# Patient Record
Sex: Male | Born: 1950
Health system: Southern US, Community
[De-identification: ages and names within clinical notes are randomized; demographics above are authoritative.]

## PROBLEM LIST (undated history)

## (undated) DIAGNOSIS — E7800 Pure hypercholesterolemia, unspecified: Secondary | ICD-10-CM

## (undated) DIAGNOSIS — R7303 Prediabetes: Secondary | ICD-10-CM

## (undated) HISTORY — PX: BACK SURGERY: SHX140

## (undated) HISTORY — PX: NASAL SEPTOPLASTY W/ TURBINOPLASTY: SHX2070

## (undated) HISTORY — DX: Pure hypercholesterolemia, unspecified: E78.00

## (undated) HISTORY — DX: Prediabetes: R73.03

## (undated) HISTORY — PX: JOINT REPLACEMENT: SHX530

---

## 2000-07-30 ENCOUNTER — Ambulatory Visit (HOSPITAL_BASED_OUTPATIENT_CLINIC_OR_DEPARTMENT_OTHER): Admission: RE | Admit: 2000-07-30 | Discharge: 2000-07-30 | Payer: Self-pay | Admitting: Internal Medicine

## 2000-12-08 ENCOUNTER — Encounter: Payer: Self-pay | Admitting: Internal Medicine

## 2000-12-08 ENCOUNTER — Encounter: Admission: RE | Admit: 2000-12-08 | Discharge: 2000-12-08 | Payer: Self-pay | Admitting: Internal Medicine

## 2003-01-24 ENCOUNTER — Ambulatory Visit (HOSPITAL_COMMUNITY): Admission: RE | Admit: 2003-01-24 | Discharge: 2003-01-24 | Payer: Self-pay | Admitting: Gastroenterology

## 2005-11-26 ENCOUNTER — Emergency Department (HOSPITAL_COMMUNITY): Admission: EM | Admit: 2005-11-26 | Discharge: 2005-11-27 | Payer: Self-pay | Admitting: Emergency Medicine

## 2005-12-06 ENCOUNTER — Encounter: Admission: RE | Admit: 2005-12-06 | Discharge: 2005-12-06 | Payer: Self-pay | Admitting: Gastroenterology

## 2006-02-14 ENCOUNTER — Encounter: Admission: RE | Admit: 2006-02-14 | Discharge: 2006-02-14 | Payer: Self-pay | Admitting: Internal Medicine

## 2006-08-15 ENCOUNTER — Encounter: Admission: RE | Admit: 2006-08-15 | Discharge: 2006-08-15 | Payer: Self-pay | Admitting: Orthopedic Surgery

## 2010-04-25 ENCOUNTER — Encounter: Payer: Self-pay | Admitting: Gastroenterology

## 2012-06-28 DIAGNOSIS — J3489 Other specified disorders of nose and nasal sinuses: Secondary | ICD-10-CM | POA: Insufficient documentation

## 2014-03-24 ENCOUNTER — Encounter (HOSPITAL_COMMUNITY): Payer: Self-pay

## 2015-12-23 ENCOUNTER — Encounter: Payer: Self-pay | Admitting: Podiatry

## 2015-12-23 ENCOUNTER — Ambulatory Visit (INDEPENDENT_AMBULATORY_CARE_PROVIDER_SITE_OTHER): Payer: BLUE CROSS/BLUE SHIELD

## 2015-12-23 ENCOUNTER — Ambulatory Visit (INDEPENDENT_AMBULATORY_CARE_PROVIDER_SITE_OTHER): Payer: BLUE CROSS/BLUE SHIELD | Admitting: Podiatry

## 2015-12-23 DIAGNOSIS — M779 Enthesopathy, unspecified: Secondary | ICD-10-CM

## 2015-12-23 DIAGNOSIS — M79671 Pain in right foot: Secondary | ICD-10-CM | POA: Diagnosis not present

## 2015-12-23 DIAGNOSIS — M2041 Other hammer toe(s) (acquired), right foot: Secondary | ICD-10-CM | POA: Diagnosis not present

## 2015-12-23 DIAGNOSIS — M79672 Pain in left foot: Secondary | ICD-10-CM | POA: Diagnosis not present

## 2015-12-23 MED ORDER — TRIAMCINOLONE ACETONIDE 10 MG/ML IJ SUSP
10.0000 mg | Freq: Once | INTRAMUSCULAR | Status: AC
  Administered 2015-12-23: 10 mg

## 2015-12-23 NOTE — Progress Notes (Signed)
Subjective:     Patient ID: Joshua LutesRondol L Winnett, male   DOB: 08/08/1950, 65 y.o.   MRN: [id number]  HPI patient states my right fifth digit has started to really bother me and there is inflammation and fluid in it and I especially have trouble with golf shoes   Review of Systems  All other systems reviewed and are negative.      Objective:   Physical Exam  Constitutional: He is oriented to person, place, and time.  Cardiovascular: Intact distal pulses.   Musculoskeletal: Normal range of motion.  Neurological: He is oriented to person, place, and time.  Skin: Skin is warm.  Nursing note and vitals reviewed.  neurovascular status intact muscle strength adequate range of motion within normal limits with patient noted to have inflammation and pain around the fifth digit right with fluid buildup around the toe itself. Patient is noted to have a rotation of the toe with redness around this area that's localized in nature and has good digital perfusion and is well oriented 3     Assessment:     Hammertoe deformity with inflammatory changes at the fifth digit right foot with pain    Plan:     H&P condition reviewed did proximal nerve block and carefully reduced fluid from the joint with 2 mg [place] some Kenalog 5 mill grams Xylocaine and applied padding. Reviewed x-rays and the fact that surgery may be necessary for this someday  X-ray report indicates hypertrophy of the head of proximal phalanx fifth digit right foot

## 2015-12-23 NOTE — Progress Notes (Signed)
   Subjective:    Patient ID: Joshua LutesRondol L Badders, male    DOB: 1950/11/28, 65 y.o.   MRN: [id number]  HPI Chief Complaint  Patient presents with  . Nail Problem    R 5th hammertoe ... Pt states he would like advise on golf shoes.       Review of Systems  All other systems reviewed and are negative.      Objective:   Physical Exam        Assessment & Plan:

## 2015-12-28 ENCOUNTER — Telehealth: Payer: Self-pay

## 2015-12-28 NOTE — Telephone Encounter (Signed)
Pt called stating that he is still in pain after the shot and wants to know if there is anything outside of surgery that he can do to relief the pain  X-ray report indicates hypertrophy of the head of proximal phalanx fifth digit right foot    Hammertoe deformity with inflammatory changes at the fifth digit right foot with pain

## 2015-12-29 ENCOUNTER — Telehealth: Payer: Self-pay | Admitting: *Deleted

## 2015-12-29 NOTE — Telephone Encounter (Addendum)
Pt called states he would like to know the next step for help with his hammer toe, the injection lasted only over the weekend. I told pt I reviewed his LOV and it appeared his problem was 2 fold, not just a hammertoe, but also capsulitis which is an inflammation of a fibrous capsule that hold the toe and metatarsal of the foot together, and contains synovial fluid for lubrication and cooling as the joint moves, that in cases of injury whether by over use or structure problems like hammertoe, it gets inflamed. I told pt the injection Dr. Charlsie Merlesegal gave was for the inflammation and the silicone shield and sleeve were to keep the toe from rubbing the shoe. Pt states the shield does give some comfort, but what else can be done other than surgery, that it had been a year since his last visit,but wasn't getting the same results.  I told pt that in some cases the problem has reached a point where palliative care, is not enough and he may need to discuss other options with Dr. Charlsie Merlesegal. I told pt to begin with wearing a stiff bottomed shoe to decrease bending in the joint, which is continuing to injure the area, wear the sleeve or shield that works best for him and I would call back with orders from Dr. Charlsie Merlesegal. 12/30/2015-Pt called for Dr. Beverlee Nimsegal's instructions.  Dr. Charlsie Merlesegal states may try the BioSkin hammer toe splint or may be fitted with Air fracture walker to totally immobilize area to rest foot structures. Pt states he would like to be fitted with the BioSkin hammer toe splint, transferred pt to schedulers to be scheduled on the Nurses Schedule to be fitted.

## 2015-12-29 NOTE — Telephone Encounter (Signed)
Could try a thick orthotic to dispense and also could immobilize with air fx. walker

## 2015-12-29 NOTE — Telephone Encounter (Signed)
Shoe gear modifications and padding

## 2015-12-31 ENCOUNTER — Ambulatory Visit (INDEPENDENT_AMBULATORY_CARE_PROVIDER_SITE_OTHER): Payer: BLUE CROSS/BLUE SHIELD | Admitting: Podiatry

## 2015-12-31 DIAGNOSIS — M779 Enthesopathy, unspecified: Secondary | ICD-10-CM

## 2015-12-31 DIAGNOSIS — M2041 Other hammer toe(s) (acquired), right foot: Secondary | ICD-10-CM | POA: Diagnosis not present

## 2015-12-31 MED ORDER — TRIAMCINOLONE ACETONIDE 10 MG/ML IJ SUSP
10.0000 mg | Freq: Once | INTRAMUSCULAR | Status: AC
  Administered 2015-12-31: 10 mg

## 2016-01-01 NOTE — Progress Notes (Signed)
Subjective:     Patient ID: Joshua Ellis, male   DOB: February 15, 1951, 65 y.o.   MRN: [id number]  HPI patient continues to experience discomfort between the fourth and fifth toes right and on the fifth digit and states the injection that I did on the outside of the toe was not successful   Review of Systems     Objective:   Physical Exam Neurovascular status intact with patient noted to have discoloration irritation around the head of the proximal phalanx fifth digit right with no keratotic tissue formation and inflammation fourth interspace right foot    Assessment:     Difficult to tell why he continues to experience this discomfort and I did discuss he may ultimately require arthroplasty and partial syndactylization but today I injected the fourth interspace 3 mg dexamethasone Kenalog 5 mg Xylocaine and advised on wider shoes    Plan:     Injection administered tolerated well and again may require surgery which I discussed with

## 2017-03-20 ENCOUNTER — Ambulatory Visit: Payer: BLUE CROSS/BLUE SHIELD | Admitting: Podiatry

## 2017-03-20 ENCOUNTER — Encounter: Payer: Self-pay | Admitting: Podiatry

## 2017-03-20 ENCOUNTER — Other Ambulatory Visit: Payer: Self-pay | Admitting: Podiatry

## 2017-03-20 ENCOUNTER — Ambulatory Visit (INDEPENDENT_AMBULATORY_CARE_PROVIDER_SITE_OTHER): Payer: BLUE CROSS/BLUE SHIELD

## 2017-03-20 DIAGNOSIS — M2040 Other hammer toe(s) (acquired), unspecified foot: Secondary | ICD-10-CM

## 2017-03-20 DIAGNOSIS — M21622 Bunionette of left foot: Secondary | ICD-10-CM

## 2017-03-20 DIAGNOSIS — M779 Enthesopathy, unspecified: Secondary | ICD-10-CM | POA: Diagnosis not present

## 2017-03-20 DIAGNOSIS — M79672 Pain in left foot: Secondary | ICD-10-CM

## 2017-03-20 MED ORDER — TRIAMCINOLONE ACETONIDE 10 MG/ML IJ SUSP
10.0000 mg | Freq: Once | INTRAMUSCULAR | Status: AC
  Administered 2017-03-20: 10 mg

## 2017-03-21 NOTE — Progress Notes (Signed)
Subjective:   Patient ID: Joshua Ellis, male   DOB: 66 y.o.   MRN: [id number]   HPI Patient presents stating that the outside of my left foot has started to hurt the way my right foot had in the past.  It is hard to wear shoe gear with comfortably in certain shoes aggravated   ROS      Objective:  Physical Exam  Neurovascular status intact with inflammation pain in the left fifth metatarsal head with fluid buildup around the joint itself     Assessment:  Inflammatory capsulitis around the fifth MPJ left     Plan:  H&P condition reviewed and advised on wider shoes and did careful steroidal injection around the fifth MPJ 3 mg dexamethasone Kenalog 5 mg Xylocaine and advised on soaks.  Reappoint for us to recheck

## 2017-04-27 DIAGNOSIS — M25511 Pain in right shoulder: Secondary | ICD-10-CM | POA: Insufficient documentation

## 2017-05-18 DIAGNOSIS — N529 Male erectile dysfunction, unspecified: Secondary | ICD-10-CM | POA: Insufficient documentation

## 2017-06-16 DIAGNOSIS — H6123 Impacted cerumen, bilateral: Secondary | ICD-10-CM | POA: Insufficient documentation

## 2017-06-16 DIAGNOSIS — R519 Headache, unspecified: Secondary | ICD-10-CM | POA: Insufficient documentation

## 2017-06-16 DIAGNOSIS — R51 Headache: Secondary | ICD-10-CM

## 2017-06-16 DIAGNOSIS — G8929 Other chronic pain: Secondary | ICD-10-CM | POA: Insufficient documentation

## 2017-10-02 ENCOUNTER — Encounter: Payer: Self-pay | Admitting: Podiatry

## 2017-10-02 ENCOUNTER — Ambulatory Visit: Payer: BLUE CROSS/BLUE SHIELD | Admitting: Podiatry

## 2017-10-02 DIAGNOSIS — M779 Enthesopathy, unspecified: Secondary | ICD-10-CM | POA: Diagnosis not present

## 2017-10-02 MED ORDER — TRIAMCINOLONE ACETONIDE 10 MG/ML IJ SUSP
10.0000 mg | Freq: Once | INTRAMUSCULAR | Status: AC
  Administered 2017-10-02: 10 mg

## 2017-10-03 NOTE — Progress Notes (Signed)
Subjective:   Patient ID: Joshua Ellis, male   DOB: 67 y.o.   MRN: [id number]   HPI Patient points to the outside left fifth metatarsal states is been getting sore and making it hard to wear shoe gear comfortably   ROS      Objective:  Physical Exam  Neurovascular status intact with patient's left fifth MPJ found to have fluid accumulation     Assessment:  Inflammatory capsulitis fifth MPJ left     Plan:  Careful injection left fifth MPJ administered 3 mg Kenalog 5 mg Xylocaine advised on wider shoes and reappoint to recheck

## 2018-01-10 DIAGNOSIS — M542 Cervicalgia: Secondary | ICD-10-CM | POA: Diagnosis not present

## 2018-01-10 DIAGNOSIS — M25511 Pain in right shoulder: Secondary | ICD-10-CM | POA: Diagnosis not present

## 2018-01-17 DIAGNOSIS — M75101 Unspecified rotator cuff tear or rupture of right shoulder, not specified as traumatic: Secondary | ICD-10-CM | POA: Diagnosis not present

## 2018-01-17 DIAGNOSIS — S161XXD Strain of muscle, fascia and tendon at neck level, subsequent encounter: Secondary | ICD-10-CM | POA: Diagnosis not present

## 2018-01-24 DIAGNOSIS — M542 Cervicalgia: Secondary | ICD-10-CM | POA: Diagnosis not present

## 2018-01-27 DIAGNOSIS — R200 Anesthesia of skin: Secondary | ICD-10-CM | POA: Diagnosis not present

## 2018-01-29 DIAGNOSIS — M542 Cervicalgia: Secondary | ICD-10-CM | POA: Diagnosis not present

## 2018-01-29 DIAGNOSIS — M545 Low back pain: Secondary | ICD-10-CM | POA: Diagnosis not present

## 2018-01-31 ENCOUNTER — Other Ambulatory Visit (HOSPITAL_COMMUNITY): Payer: Self-pay | Admitting: Orthopaedic Surgery

## 2018-01-31 DIAGNOSIS — M5442 Lumbago with sciatica, left side: Secondary | ICD-10-CM

## 2018-01-31 DIAGNOSIS — M542 Cervicalgia: Secondary | ICD-10-CM

## 2018-02-02 ENCOUNTER — Ambulatory Visit (HOSPITAL_COMMUNITY)
Admission: RE | Admit: 2018-02-02 | Discharge: 2018-02-02 | Disposition: A | Discharge status: Home / Self Care | Payer: BLUE CROSS/BLUE SHIELD | Source: Ambulatory Visit | Attending: Orthopaedic Surgery | Admitting: Orthopaedic Surgery

## 2018-02-02 ENCOUNTER — Ambulatory Visit (HOSPITAL_COMMUNITY): Payer: BLUE CROSS/BLUE SHIELD

## 2018-02-05 ENCOUNTER — Encounter (HOSPITAL_COMMUNITY): Payer: Self-pay

## 2018-02-05 ENCOUNTER — Observation Stay (HOSPITAL_COMMUNITY)
Admission: RE | Admit: 2018-02-05 | Discharge: 2018-02-05 | Disposition: A | Discharge status: Home / Self Care | Payer: Medicare Other | Source: Ambulatory Visit | Attending: Orthopaedic Surgery | Admitting: Orthopaedic Surgery

## 2018-02-05 ENCOUNTER — Ambulatory Visit (HOSPITAL_COMMUNITY)
Admission: RE | Admit: 2018-02-05 | Discharge: 2018-02-05 | Disposition: A | Discharge status: Home / Self Care | Payer: Medicare Other | Source: Ambulatory Visit | Attending: Orthopaedic Surgery | Admitting: Orthopaedic Surgery

## 2018-02-05 ENCOUNTER — Ambulatory Visit (HOSPITAL_COMMUNITY): Payer: Medicare Other

## 2018-02-05 DIAGNOSIS — M542 Cervicalgia: Secondary | ICD-10-CM

## 2018-02-05 DIAGNOSIS — M48061 Spinal stenosis, lumbar region without neurogenic claudication: Secondary | ICD-10-CM | POA: Diagnosis not present

## 2018-02-05 DIAGNOSIS — M5136 Other intervertebral disc degeneration, lumbar region: Secondary | ICD-10-CM | POA: Diagnosis not present

## 2018-02-05 DIAGNOSIS — M545 Low back pain: Secondary | ICD-10-CM | POA: Diagnosis not present

## 2018-02-05 DIAGNOSIS — M5442 Lumbago with sciatica, left side: Secondary | ICD-10-CM

## 2018-02-05 DIAGNOSIS — M5126 Other intervertebral disc displacement, lumbar region: Secondary | ICD-10-CM | POA: Diagnosis not present

## 2018-02-13 DIAGNOSIS — M5416 Radiculopathy, lumbar region: Secondary | ICD-10-CM | POA: Diagnosis not present

## 2018-02-13 DIAGNOSIS — R030 Elevated blood-pressure reading, without diagnosis of hypertension: Secondary | ICD-10-CM | POA: Diagnosis not present

## 2018-02-13 DIAGNOSIS — M9983 Other biomechanical lesions of lumbar region: Secondary | ICD-10-CM | POA: Diagnosis not present

## 2018-02-13 DIAGNOSIS — M542 Cervicalgia: Secondary | ICD-10-CM | POA: Diagnosis not present

## 2018-02-13 DIAGNOSIS — Z6825 Body mass index (BMI) 25.0-25.9, adult: Secondary | ICD-10-CM | POA: Diagnosis not present

## 2018-02-13 DIAGNOSIS — M5136 Other intervertebral disc degeneration, lumbar region: Secondary | ICD-10-CM | POA: Diagnosis not present

## 2018-02-21 DIAGNOSIS — M25511 Pain in right shoulder: Secondary | ICD-10-CM | POA: Diagnosis not present

## 2018-03-20 DIAGNOSIS — N529 Male erectile dysfunction, unspecified: Secondary | ICD-10-CM | POA: Diagnosis not present

## 2018-03-21 DIAGNOSIS — H43813 Vitreous degeneration, bilateral: Secondary | ICD-10-CM | POA: Diagnosis not present

## 2018-03-21 DIAGNOSIS — H34831 Tributary (branch) retinal vein occlusion, right eye, with macular edema: Secondary | ICD-10-CM | POA: Diagnosis not present

## 2018-03-21 DIAGNOSIS — H43392 Other vitreous opacities, left eye: Secondary | ICD-10-CM | POA: Diagnosis not present

## 2018-03-21 DIAGNOSIS — H3582 Retinal ischemia: Secondary | ICD-10-CM | POA: Diagnosis not present

## 2018-03-23 DIAGNOSIS — M25511 Pain in right shoulder: Secondary | ICD-10-CM | POA: Diagnosis not present

## 2018-04-06 DIAGNOSIS — N429 Disorder of prostate, unspecified: Secondary | ICD-10-CM | POA: Diagnosis not present

## 2018-04-06 DIAGNOSIS — N529 Male erectile dysfunction, unspecified: Secondary | ICD-10-CM | POA: Diagnosis not present

## 2018-04-12 DIAGNOSIS — S161XXA Strain of muscle, fascia and tendon at neck level, initial encounter: Secondary | ICD-10-CM | POA: Diagnosis not present

## 2018-04-14 DIAGNOSIS — M542 Cervicalgia: Secondary | ICD-10-CM | POA: Diagnosis not present

## 2018-04-14 DIAGNOSIS — M47812 Spondylosis without myelopathy or radiculopathy, cervical region: Secondary | ICD-10-CM | POA: Diagnosis not present

## 2018-04-17 DIAGNOSIS — N528 Other male erectile dysfunction: Secondary | ICD-10-CM | POA: Diagnosis not present

## 2018-04-25 DIAGNOSIS — Z23 Encounter for immunization: Secondary | ICD-10-CM | POA: Diagnosis not present

## 2018-04-25 DIAGNOSIS — R7303 Prediabetes: Secondary | ICD-10-CM | POA: Diagnosis not present

## 2018-04-25 DIAGNOSIS — Z125 Encounter for screening for malignant neoplasm of prostate: Secondary | ICD-10-CM | POA: Diagnosis not present

## 2018-04-25 DIAGNOSIS — Z0000 Encounter for general adult medical examination without abnormal findings: Secondary | ICD-10-CM | POA: Diagnosis not present

## 2018-04-25 DIAGNOSIS — E7800 Pure hypercholesterolemia, unspecified: Secondary | ICD-10-CM | POA: Diagnosis not present

## 2018-04-27 DIAGNOSIS — H34831 Tributary (branch) retinal vein occlusion, right eye, with macular edema: Secondary | ICD-10-CM | POA: Diagnosis not present

## 2018-05-08 DIAGNOSIS — M25511 Pain in right shoulder: Secondary | ICD-10-CM | POA: Diagnosis not present

## 2018-05-08 DIAGNOSIS — M13811 Other specified arthritis, right shoulder: Secondary | ICD-10-CM | POA: Diagnosis not present

## 2018-05-08 DIAGNOSIS — M13819 Other specified arthritis, unspecified shoulder: Secondary | ICD-10-CM | POA: Diagnosis not present

## 2018-05-08 DIAGNOSIS — M75111 Incomplete rotator cuff tear or rupture of right shoulder, not specified as traumatic: Secondary | ICD-10-CM | POA: Diagnosis not present

## 2018-05-14 DIAGNOSIS — M25511 Pain in right shoulder: Secondary | ICD-10-CM | POA: Diagnosis not present

## 2018-05-17 DIAGNOSIS — M75120 Complete rotator cuff tear or rupture of unspecified shoulder, not specified as traumatic: Secondary | ICD-10-CM | POA: Diagnosis not present

## 2018-05-17 DIAGNOSIS — M25511 Pain in right shoulder: Secondary | ICD-10-CM | POA: Diagnosis not present

## 2018-06-04 DIAGNOSIS — G8918 Other acute postprocedural pain: Secondary | ICD-10-CM | POA: Diagnosis not present

## 2018-06-04 DIAGNOSIS — M75111 Incomplete rotator cuff tear or rupture of right shoulder, not specified as traumatic: Secondary | ICD-10-CM | POA: Diagnosis not present

## 2018-06-04 DIAGNOSIS — M7541 Impingement syndrome of right shoulder: Secondary | ICD-10-CM | POA: Diagnosis not present

## 2018-06-04 DIAGNOSIS — M75121 Complete rotator cuff tear or rupture of right shoulder, not specified as traumatic: Secondary | ICD-10-CM | POA: Diagnosis not present

## 2018-06-22 DIAGNOSIS — M25611 Stiffness of right shoulder, not elsewhere classified: Secondary | ICD-10-CM | POA: Diagnosis not present

## 2018-06-25 DIAGNOSIS — M25611 Stiffness of right shoulder, not elsewhere classified: Secondary | ICD-10-CM | POA: Diagnosis not present

## 2018-06-27 DIAGNOSIS — M25611 Stiffness of right shoulder, not elsewhere classified: Secondary | ICD-10-CM | POA: Diagnosis not present

## 2018-07-03 ENCOUNTER — Other Ambulatory Visit: Payer: Self-pay | Admitting: Specialist

## 2018-07-03 DIAGNOSIS — M25511 Pain in right shoulder: Secondary | ICD-10-CM

## 2018-07-04 ENCOUNTER — Other Ambulatory Visit: Payer: Self-pay

## 2018-07-04 ENCOUNTER — Ambulatory Visit
Admission: RE | Admit: 2018-07-04 | Discharge: 2018-07-04 | Disposition: A | Discharge status: Home / Self Care | Payer: Medicare Other | Source: Ambulatory Visit | Attending: Specialist | Admitting: Specialist

## 2018-07-04 DIAGNOSIS — M25511 Pain in right shoulder: Secondary | ICD-10-CM | POA: Diagnosis not present

## 2018-07-19 DIAGNOSIS — M25611 Stiffness of right shoulder, not elsewhere classified: Secondary | ICD-10-CM | POA: Diagnosis not present

## 2018-07-24 DIAGNOSIS — M25511 Pain in right shoulder: Secondary | ICD-10-CM | POA: Diagnosis not present

## 2018-07-26 DIAGNOSIS — M25511 Pain in right shoulder: Secondary | ICD-10-CM | POA: Diagnosis not present

## 2018-07-31 DIAGNOSIS — M25611 Stiffness of right shoulder, not elsewhere classified: Secondary | ICD-10-CM | POA: Diagnosis not present

## 2018-08-02 DIAGNOSIS — M25611 Stiffness of right shoulder, not elsewhere classified: Secondary | ICD-10-CM | POA: Diagnosis not present

## 2018-08-07 DIAGNOSIS — M25511 Pain in right shoulder: Secondary | ICD-10-CM | POA: Diagnosis not present

## 2018-08-10 DIAGNOSIS — M25511 Pain in right shoulder: Secondary | ICD-10-CM | POA: Diagnosis not present

## 2018-08-14 DIAGNOSIS — M25511 Pain in right shoulder: Secondary | ICD-10-CM | POA: Diagnosis not present

## 2018-08-16 DIAGNOSIS — M25511 Pain in right shoulder: Secondary | ICD-10-CM | POA: Diagnosis not present

## 2018-08-21 DIAGNOSIS — M25511 Pain in right shoulder: Secondary | ICD-10-CM | POA: Diagnosis not present

## 2018-08-23 DIAGNOSIS — M25511 Pain in right shoulder: Secondary | ICD-10-CM | POA: Diagnosis not present

## 2018-08-28 DIAGNOSIS — M25511 Pain in right shoulder: Secondary | ICD-10-CM | POA: Diagnosis not present

## 2018-08-30 DIAGNOSIS — M25511 Pain in right shoulder: Secondary | ICD-10-CM | POA: Diagnosis not present

## 2018-09-05 DIAGNOSIS — M25511 Pain in right shoulder: Secondary | ICD-10-CM | POA: Diagnosis not present

## 2018-09-11 DIAGNOSIS — M25511 Pain in right shoulder: Secondary | ICD-10-CM | POA: Diagnosis not present

## 2018-09-28 DIAGNOSIS — H348312 Tributary (branch) retinal vein occlusion, right eye, stable: Secondary | ICD-10-CM | POA: Diagnosis not present

## 2018-09-28 DIAGNOSIS — H43813 Vitreous degeneration, bilateral: Secondary | ICD-10-CM | POA: Diagnosis not present

## 2018-09-28 DIAGNOSIS — H43392 Other vitreous opacities, left eye: Secondary | ICD-10-CM | POA: Diagnosis not present

## 2018-11-19 DIAGNOSIS — M25511 Pain in right shoulder: Secondary | ICD-10-CM | POA: Diagnosis not present

## 2018-12-19 DIAGNOSIS — Z23 Encounter for immunization: Secondary | ICD-10-CM | POA: Diagnosis not present

## 2018-12-30 ENCOUNTER — Emergency Department (HOSPITAL_BASED_OUTPATIENT_CLINIC_OR_DEPARTMENT_OTHER): Payer: Medicare Other

## 2018-12-30 ENCOUNTER — Encounter (HOSPITAL_BASED_OUTPATIENT_CLINIC_OR_DEPARTMENT_OTHER): Payer: Self-pay | Admitting: Emergency Medicine

## 2018-12-30 ENCOUNTER — Other Ambulatory Visit: Payer: Self-pay

## 2018-12-30 ENCOUNTER — Emergency Department (HOSPITAL_BASED_OUTPATIENT_CLINIC_OR_DEPARTMENT_OTHER)
Admission: EM | Admit: 2018-12-30 | Discharge: 2018-12-30 | Disposition: A | Discharge status: Home / Self Care | Payer: Medicare Other | Attending: Emergency Medicine | Admitting: Emergency Medicine

## 2018-12-30 DIAGNOSIS — R079 Chest pain, unspecified: Secondary | ICD-10-CM | POA: Insufficient documentation

## 2018-12-30 DIAGNOSIS — Z79899 Other long term (current) drug therapy: Secondary | ICD-10-CM | POA: Diagnosis not present

## 2018-12-30 LAB — CBC WITH DIFFERENTIAL/PLATELET
Abs Immature Granulocytes: 0.01 10*3/uL (ref 0.00–0.07)
Basophils Absolute: 0 10*3/uL (ref 0.0–0.1)
Basophils Relative: 0 %
Eosinophils Absolute: 0.3 10*3/uL (ref 0.0–0.5)
Eosinophils Relative: 3 %
HCT: 43.1 % (ref 39.0–52.0)
Hemoglobin: 13.6 g/dL (ref 13.0–17.0)
Immature Granulocytes: 0 %
Lymphocytes Relative: 37 %
Lymphs Abs: 3.1 10*3/uL (ref 0.7–4.0)
MCH: 28.1 pg (ref 26.0–34.0)
MCHC: 31.6 g/dL (ref 30.0–36.0)
MCV: 89 fL (ref 80.0–100.0)
Monocytes Absolute: 0.5 10*3/uL (ref 0.1–1.0)
Monocytes Relative: 6 %
Neutro Abs: 4.5 10*3/uL (ref 1.7–7.7)
Neutrophils Relative %: 54 %
Platelets: 208 10*3/uL (ref 150–400)
RBC: 4.84 MIL/uL (ref 4.22–5.81)
RDW: 13.1 % (ref 11.5–15.5)
WBC: 8.4 10*3/uL (ref 4.0–10.5)
nRBC: 0 % (ref 0.0–0.2)

## 2018-12-30 LAB — BASIC METABOLIC PANEL
Anion gap: 9 (ref 5–15)
BUN: 12 mg/dL (ref 8–23)
CO2: 28 mmol/L (ref 22–32)
Calcium: 9.2 mg/dL (ref 8.9–10.3)
Chloride: 102 mmol/L (ref 98–111)
Creatinine, Ser: 0.83 mg/dL (ref 0.61–1.24)
GFR calc Af Amer: >60 mL/min (ref >60)
GFR calc non Af Amer: >60 mL/min (ref >60)
Glucose, Bld: 92 mg/dL (ref 70–99)
Potassium: 3.9 mmol/L (ref 3.5–5.1)
Sodium: 139 mmol/L (ref 135–145)

## 2018-12-30 LAB — TROPONIN I (HIGH SENSITIVITY): Troponin I (High Sensitivity): 6 ng/L (ref <18)

## 2018-12-30 NOTE — ED Triage Notes (Signed)
Referred by Urgent Care for further for chest pain. Denies pain at present

## 2018-12-30 NOTE — ED Notes (Signed)
ED Provider at bedside. 

## 2018-12-30 NOTE — ED Provider Notes (Signed)
[facility] EMERGENCY DEPARTMENT Provider Note   CSN: [id number] Arrival date & time: 12/30/18  1537     History   Chief Complaint Chief Complaint  Patient presents with  . Chest Pain    HPI Joshua Ellis is a 68 y.o. male.     HPI  68 year old male presents for evaluation of abnormal ECG and chest discomfort.  This morning after eating breakfast around 830 he developed an uncomfortable feeling in his left chest.  He states it was not even really a pain but something did not feel right.  Lasted only a minute or 2.  Has come and gone a couple times but is not currently present.  Wife insisted he go to a clinic and when seen there they did an ECG that showed some ST elevations and they wanted him to get checked out.  He remains asymptomatic.  During these episodes he has not had any shortness of breath, nausea, vomiting, diaphoresis.  No leg swelling.  History reviewed. No pertinent past medical history.  Patient Active Problem List   Diagnosis Date Noted  . Bilateral impacted cerumen 06/16/2017  . Chronic nonintractable headache 06/16/2017  . Erectile dysfunction 05/18/2017  . Pain in joint of right shoulder 04/27/2017  . Nasal obstruction 06/28/2012    Past Surgical History:  Procedure Laterality Date  . BACK SURGERY    . JOINT REPLACEMENT    . NASAL SEPTOPLASTY W/ TURBINOPLASTY          Home Medications    Prior to Admission medications   Medication Sig Start Date End Date Taking? Authorizing Provider  fluticasone (FLONASE) 50 MCG/ACT nasal spray Place 2 sprays into both nostrils daily.    [provider]    Family History No family history on file.  Social History Social History   Tobacco Use  . Smoking status: Never Smoker  . Smokeless tobacco: Never Used  Substance Use Topics  . Alcohol use: Never    Frequency: Never  . Drug use: Never     Allergies   Patient has no known allergies.   Review of Systems Review of  Systems  Constitutional: Negative for diaphoresis.  Respiratory: Negative for shortness of breath.   Cardiovascular: Positive for chest pain. Negative for leg swelling.  Gastrointestinal: Negative for abdominal pain, nausea and vomiting.  All other systems reviewed and are negative.    Physical Exam Updated Vital Signs BP (!) 163/95 (BP Location: Right Arm)   Pulse 73   Temp 98.4 F (36.9 C) (Oral)   Resp 16   Ht 5\' 7"  (1.702 m)   Wt 69.9 kg   SpO2 100%   BMI 24.12 kg/m   Physical Exam Vitals signs and nursing note reviewed.  Constitutional:      General: He is not in acute distress.    Appearance: He is well-developed. He is not ill-appearing or diaphoretic.  HENT:     Head: Normocephalic and atraumatic.     Right Ear: External ear normal.     Left Ear: External ear normal.     Nose: Nose normal.  Eyes:     General:        Right eye: No discharge.        Left eye: No discharge.  Neck:     Musculoskeletal: Neck supple.  Cardiovascular:     Rate and Rhythm: Normal rate and regular rhythm.     Ellis sounds: Normal Ellis sounds.  Pulmonary:  Effort: Pulmonary effort is normal.     Breath sounds: Normal breath sounds.  Chest:     Chest wall: No tenderness.  Abdominal:     Palpations: Abdomen is soft.     Tenderness: There is no abdominal tenderness.  Skin:    General: Skin is warm and dry.  Neurological:     Mental Status: He is alert.  Psychiatric:        Mood and Affect: Mood is not anxious.      ED Treatments / Results  Labs (all labs ordered are listed, but only abnormal results are displayed) Labs Reviewed  BASIC METABOLIC PANEL  CBC WITH DIFFERENTIAL/PLATELET  TROPONIN I (HIGH SENSITIVITY)    EKG EKG Interpretation  Date/Time:  Sunday December 30 2018 15:45:48 EDT Ventricular Rate:  69 PR Interval:    QRS Duration: 73 QT Interval:  390 QTC Calculation: 418 R Axis:   73 Text Interpretation:  Sinus arrhythmia Minimal ST elevation,  anterior leads Baseline wander in lead(s) V2 no acute ST/T changes No old tracing to compare Confirmed by Sherwood Gambler [phone]) on 12/30/2018 3:55:22 PM   Radiology Dg Chest Portable 1 View  Result Date: 12/30/2018 CLINICAL DATA:  Pt c/o chest pressure and "odd sensation" in left upper chest. No other symptoms or pertinent history to note. chest pain, transient EXAM: PORTABLE CHEST 1 VIEW COMPARISON:  None. FINDINGS: Normal mediastinum and cardiac silhouette. No effusion, infiltrate pneumothorax. Normal pulmonary vasculature. Rounded density adjacent to the LEFT hilum is favored an "end on" pulmonary vessel. IMPRESSION: No acute cardiopulmonary process. Electronically Signed   By: Suzy Bouchard M.D.   On: 12/30/2018 16:40    Procedures Procedures (including critical care time)  Medications Ordered in ED Medications - No data to display   Initial Impression / Assessment and Plan / ED Course  I have reviewed the triage vital signs and the nursing notes.  Pertinent labs & imaging results that were available during my care of the patient were reviewed by me and considered in my medical decision making (see chart for details).        ECG here is not concerning for ischemia.  The PCPs ECG appear to show some minimal but nonischemic ST elevation anteriorly but would not qualify for STEMI.  Given his highly atypical discomfort that was very fleeting, in combination with no symptoms while the ECG was being performed, I have very low suspicion this is ACS.  Discussed options with patient and we checked a troponin as well as some other labs and chest x-ray.  These are benign.  Offered second troponin but he declines and would like to go home.  Given the lower suspicion for ACS this is pretty reasonable and we discussed that technically MI has not been ruled out and that he should return if any symptoms recur or worsen.  Otherwise, very low suspicion for PE or dissection.  Follow-up with PCP as  needed.  Final Clinical Impressions(s) / ED Diagnoses   Final diagnoses:  Nonspecific chest pain    ED Discharge Orders    None       Sherwood Gambler, MD 12/30/18 [phone]

## 2018-12-30 NOTE — Discharge Instructions (Addendum)
If you develop recurrent, continued, or worsening chest pain, shortness of breath, fever, vomiting, abdominal or back pain, or any other new/concerning symptoms then return to the ER for evaluation.  

## 2019-01-01 DIAGNOSIS — R030 Elevated blood-pressure reading, without diagnosis of hypertension: Secondary | ICD-10-CM | POA: Diagnosis not present

## 2019-01-14 DIAGNOSIS — R21 Rash and other nonspecific skin eruption: Secondary | ICD-10-CM | POA: Diagnosis not present

## 2019-01-17 DIAGNOSIS — M25611 Stiffness of right shoulder, not elsewhere classified: Secondary | ICD-10-CM | POA: Diagnosis not present

## 2019-01-24 DIAGNOSIS — H348312 Tributary (branch) retinal vein occlusion, right eye, stable: Secondary | ICD-10-CM | POA: Diagnosis not present

## 2019-01-24 DIAGNOSIS — H524 Presbyopia: Secondary | ICD-10-CM | POA: Diagnosis not present

## 2019-01-24 DIAGNOSIS — H25813 Combined forms of age-related cataract, bilateral: Secondary | ICD-10-CM | POA: Diagnosis not present

## 2019-01-24 DIAGNOSIS — H04123 Dry eye syndrome of bilateral lacrimal glands: Secondary | ICD-10-CM | POA: Diagnosis not present

## 2019-01-24 DIAGNOSIS — H40013 Open angle with borderline findings, low risk, bilateral: Secondary | ICD-10-CM | POA: Diagnosis not present

## 2019-03-11 DIAGNOSIS — N5201 Erectile dysfunction due to arterial insufficiency: Secondary | ICD-10-CM | POA: Diagnosis not present

## 2019-04-30 DIAGNOSIS — J309 Allergic rhinitis, unspecified: Secondary | ICD-10-CM | POA: Diagnosis not present

## 2019-04-30 DIAGNOSIS — Z0000 Encounter for general adult medical examination without abnormal findings: Secondary | ICD-10-CM | POA: Diagnosis not present

## 2019-04-30 DIAGNOSIS — R7309 Other abnormal glucose: Secondary | ICD-10-CM | POA: Diagnosis not present

## 2019-04-30 DIAGNOSIS — E7800 Pure hypercholesterolemia, unspecified: Secondary | ICD-10-CM | POA: Diagnosis not present

## 2019-04-30 DIAGNOSIS — Z125 Encounter for screening for malignant neoplasm of prostate: Secondary | ICD-10-CM | POA: Diagnosis not present

## 2019-04-30 DIAGNOSIS — K589 Irritable bowel syndrome without diarrhea: Secondary | ICD-10-CM | POA: Diagnosis not present

## 2019-04-30 DIAGNOSIS — Z1389 Encounter for screening for other disorder: Secondary | ICD-10-CM | POA: Diagnosis not present

## 2019-05-09 DIAGNOSIS — S161XXA Strain of muscle, fascia and tendon at neck level, initial encounter: Secondary | ICD-10-CM | POA: Diagnosis not present

## 2019-06-17 DIAGNOSIS — H40013 Open angle with borderline findings, low risk, bilateral: Secondary | ICD-10-CM | POA: Diagnosis not present

## 2019-06-17 DIAGNOSIS — H1132 Conjunctival hemorrhage, left eye: Secondary | ICD-10-CM | POA: Diagnosis not present

## 2019-06-17 DIAGNOSIS — H04123 Dry eye syndrome of bilateral lacrimal glands: Secondary | ICD-10-CM | POA: Diagnosis not present

## 2019-06-17 DIAGNOSIS — H25813 Combined forms of age-related cataract, bilateral: Secondary | ICD-10-CM | POA: Diagnosis not present

## 2019-06-26 DIAGNOSIS — M79661 Pain in right lower leg: Secondary | ICD-10-CM | POA: Diagnosis not present

## 2019-07-08 DIAGNOSIS — M79661 Pain in right lower leg: Secondary | ICD-10-CM | POA: Diagnosis not present

## 2019-07-09 DIAGNOSIS — M79661 Pain in right lower leg: Secondary | ICD-10-CM | POA: Diagnosis not present

## 2019-07-09 DIAGNOSIS — S86111D Strain of other muscle(s) and tendon(s) of posterior muscle group at lower leg level, right leg, subsequent encounter: Secondary | ICD-10-CM | POA: Diagnosis not present

## 2019-07-11 DIAGNOSIS — S86111D Strain of other muscle(s) and tendon(s) of posterior muscle group at lower leg level, right leg, subsequent encounter: Secondary | ICD-10-CM | POA: Diagnosis not present

## 2019-07-11 DIAGNOSIS — M79661 Pain in right lower leg: Secondary | ICD-10-CM | POA: Diagnosis not present

## 2019-07-15 DIAGNOSIS — S86111D Strain of other muscle(s) and tendon(s) of posterior muscle group at lower leg level, right leg, subsequent encounter: Secondary | ICD-10-CM | POA: Diagnosis not present

## 2019-07-15 DIAGNOSIS — M79661 Pain in right lower leg: Secondary | ICD-10-CM | POA: Diagnosis not present

## 2019-07-17 DIAGNOSIS — S86111D Strain of other muscle(s) and tendon(s) of posterior muscle group at lower leg level, right leg, subsequent encounter: Secondary | ICD-10-CM | POA: Diagnosis not present

## 2019-07-17 DIAGNOSIS — M79661 Pain in right lower leg: Secondary | ICD-10-CM | POA: Diagnosis not present

## 2019-07-22 DIAGNOSIS — S86111D Strain of other muscle(s) and tendon(s) of posterior muscle group at lower leg level, right leg, subsequent encounter: Secondary | ICD-10-CM | POA: Diagnosis not present

## 2019-07-22 DIAGNOSIS — M79661 Pain in right lower leg: Secondary | ICD-10-CM | POA: Diagnosis not present

## 2019-07-24 DIAGNOSIS — S86111D Strain of other muscle(s) and tendon(s) of posterior muscle group at lower leg level, right leg, subsequent encounter: Secondary | ICD-10-CM | POA: Diagnosis not present

## 2019-07-24 DIAGNOSIS — M79661 Pain in right lower leg: Secondary | ICD-10-CM | POA: Diagnosis not present

## 2019-07-29 DIAGNOSIS — S86111D Strain of other muscle(s) and tendon(s) of posterior muscle group at lower leg level, right leg, subsequent encounter: Secondary | ICD-10-CM | POA: Diagnosis not present

## 2019-07-29 DIAGNOSIS — M79661 Pain in right lower leg: Secondary | ICD-10-CM | POA: Diagnosis not present

## 2019-07-31 DIAGNOSIS — S86111D Strain of other muscle(s) and tendon(s) of posterior muscle group at lower leg level, right leg, subsequent encounter: Secondary | ICD-10-CM | POA: Diagnosis not present

## 2019-07-31 DIAGNOSIS — M79661 Pain in right lower leg: Secondary | ICD-10-CM | POA: Diagnosis not present

## 2019-08-01 DIAGNOSIS — L818 Other specified disorders of pigmentation: Secondary | ICD-10-CM | POA: Diagnosis not present

## 2019-08-01 DIAGNOSIS — L821 Other seborrheic keratosis: Secondary | ICD-10-CM | POA: Diagnosis not present

## 2019-08-06 DIAGNOSIS — H04123 Dry eye syndrome of bilateral lacrimal glands: Secondary | ICD-10-CM | POA: Diagnosis not present

## 2019-08-06 DIAGNOSIS — T1511XA Foreign body in conjunctival sac, right eye, initial encounter: Secondary | ICD-10-CM | POA: Diagnosis not present

## 2019-08-26 DIAGNOSIS — M542 Cervicalgia: Secondary | ICD-10-CM | POA: Diagnosis not present

## 2019-09-05 DIAGNOSIS — M542 Cervicalgia: Secondary | ICD-10-CM | POA: Diagnosis not present

## 2019-09-09 DIAGNOSIS — M542 Cervicalgia: Secondary | ICD-10-CM | POA: Diagnosis not present

## 2019-09-10 DIAGNOSIS — M549 Dorsalgia, unspecified: Secondary | ICD-10-CM | POA: Diagnosis not present

## 2019-09-26 DIAGNOSIS — M9907 Segmental and somatic dysfunction of upper extremity: Secondary | ICD-10-CM | POA: Diagnosis not present

## 2019-09-26 DIAGNOSIS — M9901 Segmental and somatic dysfunction of cervical region: Secondary | ICD-10-CM | POA: Diagnosis not present

## 2019-09-26 DIAGNOSIS — M7541 Impingement syndrome of right shoulder: Secondary | ICD-10-CM | POA: Diagnosis not present

## 2019-09-26 DIAGNOSIS — M9902 Segmental and somatic dysfunction of thoracic region: Secondary | ICD-10-CM | POA: Diagnosis not present

## 2019-09-30 DIAGNOSIS — M5412 Radiculopathy, cervical region: Secondary | ICD-10-CM | POA: Insufficient documentation

## 2019-10-14 DIAGNOSIS — M5412 Radiculopathy, cervical region: Secondary | ICD-10-CM | POA: Diagnosis not present

## 2019-10-18 DIAGNOSIS — M5412 Radiculopathy, cervical region: Secondary | ICD-10-CM | POA: Diagnosis not present

## 2019-10-28 ENCOUNTER — Other Ambulatory Visit: Payer: Self-pay

## 2019-10-28 ENCOUNTER — Ambulatory Visit (INDEPENDENT_AMBULATORY_CARE_PROVIDER_SITE_OTHER): Payer: Medicare Other | Admitting: Podiatry

## 2019-10-28 ENCOUNTER — Encounter: Payer: Self-pay | Admitting: Podiatry

## 2019-10-28 DIAGNOSIS — L84 Corns and callosities: Secondary | ICD-10-CM | POA: Diagnosis not present

## 2019-10-28 NOTE — Progress Notes (Signed)
Subjective:   Patient ID: Joshua Ellis, male   DOB: 69 y.o.   MRN: [id number]   HPI Patient presents with lesions on the big toes of both feet that is localized and states that pain is not present currently after pedicure   ROS      Objective:  Physical Exam  Neurovascular status intact with keratotic lesion on the side of the big toe of both feet low-grade in intensity      Assessment:  Keratotic lesion secondary to pressure     Plan:  Advised on continued pedicures and explained the origin of this condition with gait being the problem

## 2019-11-04 DIAGNOSIS — M549 Dorsalgia, unspecified: Secondary | ICD-10-CM | POA: Diagnosis not present

## 2019-11-18 DIAGNOSIS — M549 Dorsalgia, unspecified: Secondary | ICD-10-CM | POA: Diagnosis not present

## 2019-12-04 DIAGNOSIS — N5201 Erectile dysfunction due to arterial insufficiency: Secondary | ICD-10-CM | POA: Diagnosis not present

## 2019-12-23 DIAGNOSIS — M25531 Pain in right wrist: Secondary | ICD-10-CM | POA: Diagnosis not present

## 2020-01-02 DIAGNOSIS — H524 Presbyopia: Secondary | ICD-10-CM | POA: Diagnosis not present

## 2020-01-02 DIAGNOSIS — H40013 Open angle with borderline findings, low risk, bilateral: Secondary | ICD-10-CM | POA: Diagnosis not present

## 2020-01-02 DIAGNOSIS — H25813 Combined forms of age-related cataract, bilateral: Secondary | ICD-10-CM | POA: Diagnosis not present

## 2020-01-02 DIAGNOSIS — H04123 Dry eye syndrome of bilateral lacrimal glands: Secondary | ICD-10-CM | POA: Diagnosis not present

## 2020-01-06 DIAGNOSIS — M19012 Primary osteoarthritis, left shoulder: Secondary | ICD-10-CM | POA: Insufficient documentation

## 2020-01-06 DIAGNOSIS — M25512 Pain in left shoulder: Secondary | ICD-10-CM | POA: Diagnosis not present

## 2020-01-21 DIAGNOSIS — M25512 Pain in left shoulder: Secondary | ICD-10-CM | POA: Diagnosis not present

## 2020-01-30 DIAGNOSIS — M5412 Radiculopathy, cervical region: Secondary | ICD-10-CM | POA: Diagnosis not present

## 2020-01-30 DIAGNOSIS — M25512 Pain in left shoulder: Secondary | ICD-10-CM | POA: Diagnosis not present

## 2020-02-04 DIAGNOSIS — M5021 Other cervical disc displacement,  high cervical region: Secondary | ICD-10-CM | POA: Diagnosis not present

## 2020-02-04 DIAGNOSIS — M4802 Spinal stenosis, cervical region: Secondary | ICD-10-CM | POA: Diagnosis not present

## 2020-02-07 DIAGNOSIS — M5412 Radiculopathy, cervical region: Secondary | ICD-10-CM | POA: Diagnosis not present

## 2020-02-11 DIAGNOSIS — M5412 Radiculopathy, cervical region: Secondary | ICD-10-CM | POA: Diagnosis not present

## 2020-02-21 DIAGNOSIS — M542 Cervicalgia: Secondary | ICD-10-CM | POA: Diagnosis not present

## 2020-02-26 DIAGNOSIS — M542 Cervicalgia: Secondary | ICD-10-CM | POA: Diagnosis not present

## 2020-02-26 DIAGNOSIS — M25512 Pain in left shoulder: Secondary | ICD-10-CM | POA: Diagnosis not present

## 2020-03-02 ENCOUNTER — Ambulatory Visit
Admission: RE | Admit: 2020-03-02 | Discharge: 2020-03-02 | Disposition: A | Discharge status: Home / Self Care | Payer: Medicare Other | Source: Ambulatory Visit | Attending: Physician Assistant | Admitting: Physician Assistant

## 2020-03-02 ENCOUNTER — Other Ambulatory Visit: Payer: Self-pay | Admitting: Physician Assistant

## 2020-03-02 DIAGNOSIS — M47816 Spondylosis without myelopathy or radiculopathy, lumbar region: Secondary | ICD-10-CM | POA: Diagnosis not present

## 2020-03-02 DIAGNOSIS — M545 Low back pain, unspecified: Secondary | ICD-10-CM | POA: Diagnosis not present

## 2020-03-02 DIAGNOSIS — M47817 Spondylosis without myelopathy or radiculopathy, lumbosacral region: Secondary | ICD-10-CM | POA: Diagnosis not present

## 2020-03-02 DIAGNOSIS — M1612 Unilateral primary osteoarthritis, left hip: Secondary | ICD-10-CM | POA: Diagnosis not present

## 2020-03-02 DIAGNOSIS — M79605 Pain in left leg: Secondary | ICD-10-CM

## 2020-03-02 DIAGNOSIS — M1712 Unilateral primary osteoarthritis, left knee: Secondary | ICD-10-CM | POA: Diagnosis not present

## 2020-03-02 DIAGNOSIS — Z23 Encounter for immunization: Secondary | ICD-10-CM | POA: Diagnosis not present

## 2020-03-04 DIAGNOSIS — M169 Osteoarthritis of hip, unspecified: Secondary | ICD-10-CM | POA: Diagnosis not present

## 2020-03-04 DIAGNOSIS — M47816 Spondylosis without myelopathy or radiculopathy, lumbar region: Secondary | ICD-10-CM | POA: Diagnosis not present

## 2020-03-05 DIAGNOSIS — M5412 Radiculopathy, cervical region: Secondary | ICD-10-CM | POA: Diagnosis not present

## 2020-03-11 DIAGNOSIS — M25552 Pain in left hip: Secondary | ICD-10-CM | POA: Diagnosis not present

## 2020-03-11 DIAGNOSIS — M79605 Pain in left leg: Secondary | ICD-10-CM | POA: Diagnosis not present

## 2020-03-11 DIAGNOSIS — R202 Paresthesia of skin: Secondary | ICD-10-CM | POA: Diagnosis not present

## 2020-03-12 DIAGNOSIS — M5412 Radiculopathy, cervical region: Secondary | ICD-10-CM | POA: Diagnosis not present

## 2020-03-18 DIAGNOSIS — M79605 Pain in left leg: Secondary | ICD-10-CM | POA: Diagnosis not present

## 2020-03-18 DIAGNOSIS — M25552 Pain in left hip: Secondary | ICD-10-CM | POA: Diagnosis not present

## 2020-03-18 DIAGNOSIS — R202 Paresthesia of skin: Secondary | ICD-10-CM | POA: Diagnosis not present

## 2020-03-20 DIAGNOSIS — M79605 Pain in left leg: Secondary | ICD-10-CM | POA: Diagnosis not present

## 2020-03-20 DIAGNOSIS — M25552 Pain in left hip: Secondary | ICD-10-CM | POA: Diagnosis not present

## 2020-03-20 DIAGNOSIS — R202 Paresthesia of skin: Secondary | ICD-10-CM | POA: Diagnosis not present

## 2020-03-30 DIAGNOSIS — M7541 Impingement syndrome of right shoulder: Secondary | ICD-10-CM | POA: Diagnosis not present

## 2020-04-17 DIAGNOSIS — M25511 Pain in right shoulder: Secondary | ICD-10-CM | POA: Diagnosis not present

## 2020-04-27 DIAGNOSIS — M25511 Pain in right shoulder: Secondary | ICD-10-CM | POA: Diagnosis not present

## 2020-05-01 DIAGNOSIS — Z0000 Encounter for general adult medical examination without abnormal findings: Secondary | ICD-10-CM | POA: Diagnosis not present

## 2020-05-01 DIAGNOSIS — M47816 Spondylosis without myelopathy or radiculopathy, lumbar region: Secondary | ICD-10-CM | POA: Diagnosis not present

## 2020-05-01 DIAGNOSIS — R7309 Other abnormal glucose: Secondary | ICD-10-CM | POA: Diagnosis not present

## 2020-05-01 DIAGNOSIS — E7800 Pure hypercholesterolemia, unspecified: Secondary | ICD-10-CM | POA: Diagnosis not present

## 2020-05-01 DIAGNOSIS — Z1389 Encounter for screening for other disorder: Secondary | ICD-10-CM | POA: Diagnosis not present

## 2020-05-16 DIAGNOSIS — S61209A Unspecified open wound of unspecified finger without damage to nail, initial encounter: Secondary | ICD-10-CM | POA: Diagnosis not present

## 2020-05-16 DIAGNOSIS — W268XXA Contact with other sharp object(s), not elsewhere classified, initial encounter: Secondary | ICD-10-CM | POA: Diagnosis not present

## 2020-05-27 NOTE — Progress Notes (Signed)
Cardiology Office Note:    Date:  05/28/2020   ID:  Joshua Ellis, DOB [birth year], MRN [id number]  PCP:  Joshua Housekeeper, MD  Cardiologist:  Joshua Ellis.   Referring MD: Joshua Housekeeper, MD   Chief Complaint  Patient presents with  . Hyperlipidemia    History of Present Illness:    Joshua Ellis is a 69 y.o. male with a hx of hyperlipidemia,, Prediabetes, total knee replacement, elevated BP without diagnosis of hypertension, erectile dysfunction, and prior history of chest pain (2020).  Joshua Ellis has a family history of CAD (father died suddenly at age 48).  Otherwise Joshua significant risk factors other than prediabetes and hyperlipidemia.  LDL cholesterol levels have been noted to be greater than 130.  He is on multiple over-the-counter natural therapies with most recent LDL of 140 total cholesterol 217.  HDL cholesterol was 61 on blood work performed in January 2022.  He denies claudication, orthopnea, palpitations, syncope, angina, and stroke.   Past Medical History:  Diagnosis Date  . Hypercholesteremia   . Prediabetes     Past Surgical History:  Procedure Laterality Date  . BACK SURGERY    . JOINT REPLACEMENT    . NASAL SEPTOPLASTY W/ TURBINOPLASTY      Current Medications: Current Meds  Medication Sig  . Ascorbic Acid (VITAMIN C PO) Take 1 capsule by mouth daily.  . Cyanocobalamin (VITAMIN B-12 PO) Take 1 tablet by mouth daily.  . Flaxseed, Linseed, (FLAX SEEDS PO) Take by mouth daily.  . fluticasone (FLONASE) 50 MCG/ACT nasal spray Place 2 sprays into both nostrils daily. Takes PRN  . Garlic 100 MG TABS Take 100 mg by mouth daily.  Joshua Ellis Oil Omega-3 500 MG CAPS Take 500 mg by mouth daily.  . Multiple Vitamin (MULTIVITAMIN) capsule Take 1 capsule by mouth daily.  . [DISCONTINUED] sildenafil (REVATIO) 20 MG tablet Take 20-40 mg by mouth daily as needed.     Allergies:   Patient has Joshua known allergies.   Social History    Socioeconomic History  . Marital status: Single    Spouse name: Not on Ellis  . Number of children: Not on Ellis  . Years of education: Not on Ellis  . Highest education level: Not on Ellis  Occupational History  . Not on Ellis  Tobacco Use  . Smoking status: Never Smoker  . Smokeless tobacco: Never Used  Substance and Sexual Activity  . Alcohol use: Never  . Drug use: Never  . Sexual activity: Not on Ellis  Other Topics Concern  . Not on Ellis  Social History Narrative  . Not on Ellis   Social Determinants of Health   Financial Resource Strain: Not on Ellis  Food Insecurity: Not on Ellis  Transportation Needs: Not on Ellis  Physical Activity: Not on Ellis  Stress: Not on Ellis  Social Connections: Not on Ellis     Family History: The patient's family history includes CAD in his father; Dementia in his mother; Diabetes in his father.  ROS:   Please see the history of present illness.    Joshua complaints all other systems reviewed and are negative.  EKGs/Labs/Other Studies Reviewed:    The following studies were reviewed today: None  EKG:  EKG performed September 2020 revealed normal sinus rhythm with early repole.  Recent Labs: Joshua results found for requested labs within last 8760 hours.  Recent Lipid Panel Joshua results found for: CHOL, TRIG, HDL, CHOLHDL, VLDL, LDLCALC,  LDLDIRECT  Physical Exam:    VS:  There were Joshua vitals taken for this visit.    Wt Readings from Last 3 Encounters:  12/30/18 154 lb (69.9 kg)     GEN: Healthy-appearing. Joshua acute distress HEENT: Normal NECK: Joshua JVD. LYMPHATICS: Joshua lymphadenopathy CARDIAC: Joshua murmur. RRR Joshua gallop, or edema. VASCULAR:  Normal Pulses. Joshua bruits. RESPIRATORY:  Clear to auscultation without rales, wheezing or rhonchi  ABDOMEN: Soft, non-tender, non-distended, Joshua pulsatile mass, MUSCULOSKELETAL: Joshua deformity  SKIN: Warm and dry NEUROLOGIC:  Alert and oriented x 3 PSYCHIATRIC:  Normal affect   ASSESSMENT:    1.  Hyperlipidemia LDL goal <100   2. Erectile dysfunction due to arterial insufficiency   3. Cervical disc disease   4. Elevated BP without diagnosis of hypertension   5. Educated about COVID-19 virus infection    PLAN:    In order of problems listed above:  1. Significant risk factors including prediabetes, untreated hyperlipidemia, and family history.  He is not excited about pharmacologic therapy of hyperlipidemia.  Coronary calcium score will be performed to give [place] a better idea about the magnitude of risk. 2. Likely related to atherosclerosis. 3. Not discussed. 4. Mildly elevated systolic pressure.  Will monitor. 5. Vaccinated, boosted, and practicing social distancing.  Overall education and awareness concerning primary risk prevention was discussed in detail: LDL less than 70, hemoglobin A1c less than 7, blood pressure target less than 130/80 mmHg, >150 minutes of moderate aerobic activity per week, avoidance of smoking, weight control (via diet and exercise), and continued surveillance/management of/for obstructive sleep apnea.    Medication Adjustments/Labs and Tests Ordered: Current medicines are reviewed at length with the patient today.  Concerns regarding medicines are outlined above.  Joshua orders of the defined types were placed in this encounter.  Joshua orders of the defined types were placed in this encounter.   There are Joshua Patient Instructions on Ellis for this visit.   Signed, Lesleigh Noe, MD  05/28/2020 1:57 PM    [facility]

## 2020-05-28 ENCOUNTER — Encounter: Payer: Self-pay | Admitting: Interventional Cardiology

## 2020-05-28 ENCOUNTER — Ambulatory Visit: Payer: Medicare Other | Admitting: Interventional Cardiology

## 2020-05-28 ENCOUNTER — Other Ambulatory Visit: Payer: Self-pay

## 2020-05-28 VITALS — BP 138/76 | HR 60 | Ht 67.0 in | Wt 156.0 lb

## 2020-05-28 DIAGNOSIS — E785 Hyperlipidemia, unspecified: Secondary | ICD-10-CM

## 2020-05-28 DIAGNOSIS — N5201 Erectile dysfunction due to arterial insufficiency: Secondary | ICD-10-CM | POA: Diagnosis not present

## 2020-05-28 DIAGNOSIS — M5090 Cervical disc disorder, unspecified, unspecified cervical region: Secondary | ICD-10-CM | POA: Diagnosis not present

## 2020-05-28 DIAGNOSIS — R030 Elevated blood-pressure reading, without diagnosis of hypertension: Secondary | ICD-10-CM | POA: Diagnosis not present

## 2020-05-28 DIAGNOSIS — Z7189 Other specified counseling: Secondary | ICD-10-CM

## 2020-05-28 NOTE — Patient Instructions (Signed)
Medication Instructions:  Your physician recommends that you continue on your current medications as directed. Please refer to the Current Medication list given to you today.  *If you need a refill on your cardiac medications before your next appointment, please call your pharmacy*   Lab Work: None If you have labs (blood work) drawn today and your tests are completely normal, you will receive your results only by: . MyChart Message (if you have MyChart) OR . A paper copy in the mail If you have any lab test that is abnormal or we need to change your treatment, we will call you to review the results.   Testing/Procedures: Your physician recommends that you have a Calcium Score performed.   Follow-Up: At [facility], you and your health needs are our priority.  As part of our continuing mission to provide you with exceptional heart care, we have created designated Provider Care Teams.  These Care Teams include your primary Cardiologist (physician) and Advanced Practice Providers (APPs -  Physician Assistants and Nurse Practitioners) who all work together to provide you with the care you need, when you need it.  We recommend signing up for the patient portal called "MyChart".  Sign up information is provided on this After Visit Summary.  MyChart is used to connect with patients for Virtual Visits (Telemedicine).  Patients are able to view lab/test results, encounter notes, upcoming appointments, etc.  Non-urgent messages can be sent to your provider as well.   To learn more about what you can do with MyChart, go to [email or web address].com.    Your next appointment:   As needed  The format for your next appointment:   In Person  Provider:   You may see Dr. Henry Smith or one of the following Advanced Practice Providers on your designated Care Team:    Jill McDaniel, NP    Other Instructions   

## 2020-06-25 ENCOUNTER — Ambulatory Visit (INDEPENDENT_AMBULATORY_CARE_PROVIDER_SITE_OTHER)
Admission: RE | Admit: 2020-06-25 | Discharge: 2020-06-25 | Disposition: A | Discharge status: Home / Self Care | Payer: Self-pay | Source: Ambulatory Visit | Attending: Interventional Cardiology | Admitting: Interventional Cardiology

## 2020-06-25 ENCOUNTER — Telehealth: Payer: Self-pay | Admitting: Interventional Cardiology

## 2020-06-25 ENCOUNTER — Other Ambulatory Visit: Payer: Self-pay

## 2020-06-25 DIAGNOSIS — E785 Hyperlipidemia, unspecified: Secondary | ICD-10-CM

## 2020-06-25 DIAGNOSIS — M25561 Pain in right knee: Secondary | ICD-10-CM | POA: Diagnosis not present

## 2020-06-25 NOTE — Telephone Encounter (Signed)
RN spoke to patient regarding CT results from this morning. RN explained that Dr. Katrinka Blazing has yet to review the results since the scan was just completed this morning. Patient states he was just unsure of how long it would be before he received his results and Dr.Smith's recommendations. RN assured him that we would reach out to him with results and recommendations when they are reviewed by Dr.Smith. Patient verbalized understanding and thanked Charity fundraiser for calling.

## 2020-06-25 NOTE — Telephone Encounter (Signed)
    Pt is calling about CT result, he said he got the result on mychart but no numbers or anything yet. He wanted to know what Dr. Katrinka Blazing recommendations, especially about his medications/supplements.

## 2020-06-29 ENCOUNTER — Telehealth: Payer: Self-pay | Admitting: *Deleted

## 2020-06-29 DIAGNOSIS — E785 Hyperlipidemia, unspecified: Secondary | ICD-10-CM

## 2020-06-29 MED ORDER — ROSUVASTATIN CALCIUM 20 MG PO TABS: 20.0000 mg | ORAL_TABLET | Freq: Every day | ORAL | 3 refills | Status: DC

## 2020-06-29 NOTE — Telephone Encounter (Signed)
-----   Message from Lyn Records, MD sent at 06/28/2020 10:56 AM EDT ----- LDL was 140 in January, so needs Rosuvastatin 20 mg daily with Liver and lipid in 6 weeks.

## 2020-07-21 DIAGNOSIS — H903 Sensorineural hearing loss, bilateral: Secondary | ICD-10-CM | POA: Diagnosis not present

## 2020-08-14 ENCOUNTER — Other Ambulatory Visit: Payer: Self-pay

## 2020-08-14 ENCOUNTER — Other Ambulatory Visit: Payer: Medicare Other | Admitting: *Deleted

## 2020-08-14 DIAGNOSIS — E785 Hyperlipidemia, unspecified: Secondary | ICD-10-CM

## 2020-08-14 DIAGNOSIS — M545 Low back pain, unspecified: Secondary | ICD-10-CM | POA: Diagnosis not present

## 2020-08-14 LAB — HEPATIC FUNCTION PANEL
ALT: 28 IU/L (ref 0–44)
AST: 31 IU/L (ref 0–40)
Albumin: 4.5 g/dL (ref 3.8–4.8)
Alkaline Phosphatase: 70 IU/L (ref 44–121)
Bilirubin Total: 0.7 mg/dL (ref 0.0–1.2)
Bilirubin, Direct: 0.18 mg/dL (ref 0.00–0.40)
Total Protein: 6.7 g/dL (ref 6.0–8.5)

## 2020-08-14 LAB — LIPID PANEL
Chol/HDL Ratio: 2.2 ratio (ref 0.0–5.0)
Cholesterol, Total: 140 mg/dL (ref 100–199)
HDL: 63 mg/dL (ref >39)
LDL Chol Calc (NIH): 64 mg/dL (ref 0–99)
Triglycerides: 61 mg/dL (ref 0–149)
VLDL Cholesterol Cal: 13 mg/dL (ref 5–40)

## 2020-08-17 ENCOUNTER — Other Ambulatory Visit: Payer: Self-pay | Admitting: *Deleted

## 2020-08-17 DIAGNOSIS — E785 Hyperlipidemia, unspecified: Secondary | ICD-10-CM

## 2020-08-19 DIAGNOSIS — M545 Low back pain, unspecified: Secondary | ICD-10-CM | POA: Diagnosis not present

## 2020-09-02 DIAGNOSIS — R21 Rash and other nonspecific skin eruption: Secondary | ICD-10-CM | POA: Diagnosis not present

## 2020-09-22 DIAGNOSIS — L989 Disorder of the skin and subcutaneous tissue, unspecified: Secondary | ICD-10-CM | POA: Diagnosis not present

## 2020-09-25 ENCOUNTER — Other Ambulatory Visit: Payer: Self-pay

## 2020-09-25 MED ORDER — ROSUVASTATIN CALCIUM 20 MG PO TABS: 20.0000 mg | ORAL_TABLET | Freq: Every day | ORAL | 3 refills | Status: DC

## 2020-09-25 MED ORDER — ROSUVASTATIN CALCIUM 20 MG PO TABS: 20.0000 mg | ORAL_TABLET | Freq: Every day | ORAL | 0 refills | Status: DC

## 2020-09-25 NOTE — Telephone Encounter (Signed)
Pt's medication rosuvastatin was sent to [facility] mail order pharmacy and a 15 day supply to [facility] in [place], as requested by pt, until mail order arrives. Confirmation for both.

## 2020-10-28 DIAGNOSIS — M5459 Other low back pain: Secondary | ICD-10-CM | POA: Diagnosis not present

## 2020-11-11 DIAGNOSIS — M545 Low back pain, unspecified: Secondary | ICD-10-CM | POA: Diagnosis not present

## 2020-11-23 DIAGNOSIS — M545 Low back pain, unspecified: Secondary | ICD-10-CM | POA: Diagnosis not present

## 2020-12-21 DIAGNOSIS — Z23 Encounter for immunization: Secondary | ICD-10-CM | POA: Diagnosis not present

## 2020-12-21 DIAGNOSIS — M25552 Pain in left hip: Secondary | ICD-10-CM | POA: Diagnosis not present

## 2020-12-30 DIAGNOSIS — M25512 Pain in left shoulder: Secondary | ICD-10-CM | POA: Diagnosis not present

## 2021-01-11 DIAGNOSIS — H40013 Open angle with borderline findings, low risk, bilateral: Secondary | ICD-10-CM | POA: Diagnosis not present

## 2021-01-11 DIAGNOSIS — H04123 Dry eye syndrome of bilateral lacrimal glands: Secondary | ICD-10-CM | POA: Diagnosis not present

## 2021-01-11 DIAGNOSIS — H348312 Tributary (branch) retinal vein occlusion, right eye, stable: Secondary | ICD-10-CM | POA: Diagnosis not present

## 2021-01-11 DIAGNOSIS — H25813 Combined forms of age-related cataract, bilateral: Secondary | ICD-10-CM | POA: Diagnosis not present

## 2021-02-10 ENCOUNTER — Other Ambulatory Visit: Payer: Medicare Other

## 2021-02-10 ENCOUNTER — Other Ambulatory Visit: Payer: Self-pay

## 2021-02-10 DIAGNOSIS — E785 Hyperlipidemia, unspecified: Secondary | ICD-10-CM

## 2021-02-10 LAB — HEPATIC FUNCTION PANEL
ALT: 23 IU/L (ref 0–44)
AST: 28 IU/L (ref 0–40)
Albumin: 4.4 g/dL (ref 3.8–4.8)
Alkaline Phosphatase: 77 IU/L (ref 44–121)
Bilirubin Total: 0.6 mg/dL (ref 0.0–1.2)
Bilirubin, Direct: 0.16 mg/dL (ref 0.00–0.40)
Total Protein: 6.4 g/dL (ref 6.0–8.5)

## 2021-02-10 LAB — LIPID PANEL
Chol/HDL Ratio: 2.3 ratio (ref 0.0–5.0)
Cholesterol, Total: 148 mg/dL (ref 100–199)
HDL: 63 mg/dL (ref >39)
LDL Chol Calc (NIH): 73 mg/dL (ref 0–99)
Triglycerides: 56 mg/dL (ref 0–149)
VLDL Cholesterol Cal: 12 mg/dL (ref 5–40)

## 2021-02-15 NOTE — Progress Notes (Signed)
Cardiology Office Note:    Date:  02/16/2021   ID:  Joshua Ellis, DOB May 21, 1950, MRN [id number]  PCP:  Georgann Housekeeper, MD  Cardiologist:  None   Referring MD: Georgann Housekeeper, MD   Chief Complaint  Patient presents with   Follow-up    Asymptomatic CAD Hyperlipidemia     History of Present Illness:    Joshua Ellis is a 70 y.o. male with a hx of hyperlipidemia,, Prediabetes, total knee replacement, elevated BP without diagnosis of hypertension, erectile dysfunction, and prior history of chest pain (2020).  Is doing well.  He is asymptomatic.  He is physically active.  He plays golf at least 3 times per week.  No cardiovascular complaints..  He denies  medication side effects.  Past Medical History:  Diagnosis Date   Hypercholesteremia    Prediabetes     Past Surgical History:  Procedure Laterality Date   BACK SURGERY     JOINT REPLACEMENT     NASAL SEPTOPLASTY W/ TURBINOPLASTY      Current Medications: Current Meds  Medication Sig   Ascorbic Acid (VITAMIN C PO) Take 1 capsule by mouth daily.   Cyanocobalamin (VITAMIN B-12 PO) Take 1 tablet by mouth daily.   ELDERBERRY PO Take by mouth daily.   Garlic 100 MG TABS Take 100 mg by mouth daily.   Krill Oil Omega-3 500 MG CAPS Take 500 mg by mouth daily.   Multiple Vitamin (MULTIVITAMIN) capsule Take 1 capsule by mouth daily.   rosuvastatin (CRESTOR) 20 MG tablet Take 1 tablet (20 mg total) by mouth daily.     Allergies:   Patient has no known allergies.   Social History   Socioeconomic History   Marital status: Married    Spouse name: Not on file   Number of children: Not on file   Years of education: Not on file   Highest education level: Not on file  Occupational History   Not on file  Tobacco Use   Smoking status: Never   Smokeless tobacco: Never  Substance and Sexual Activity   Alcohol use: Never   Drug use: Never   Sexual activity: Not on file  Other Topics Concern   Not on file  Social  History Narrative   Not on file   Social Determinants of Health   Financial Resource Strain: Not on file  Food Insecurity: Not on file  Transportation Needs: Not on file  Physical Activity: Not on file  Stress: Not on file  Social Connections: Not on file     Family History: The patient's family history includes CAD in his father; Dementia in his mother; Diabetes in his father.  ROS:   Please see the history of present illness.    No complaints.  Some musculoskeletal issues.  All other systems reviewed and are negative.  EKGs/Labs/Other Studies Reviewed:    The following studies were reviewed today:  CORONARY CALCIUM SCORE 06/2020: IMPRESSION: Coronary calcium score of 30. This was 57 percentile for age and sex matched control.  EKG:  EKG normal sinus rhythm with sinus arrhythmia.  Prominent voltage.  Overall normal in appearance  Recent Labs: 02/10/2021: ALT 23  Recent Lipid Panel    Component Value Date/Time   CHOL 148 02/10/2021 0755   TRIG 56 02/10/2021 0755   HDL 63 02/10/2021 0755   CHOLHDL 2.3 02/10/2021 0755   LDLCALC 73 02/10/2021 0755    Physical Exam:    VS:  BP 122/78   Pulse 65  Ht 5\' 7"  (1.702 m)   Wt 149 lb 9.6 oz (67.9 kg)   SpO2 96%   BMI 23.43 kg/m     Wt Readings from Last 3 Encounters:  02/16/21 149 lb 9.6 oz (67.9 kg)  05/28/20 156 lb (70.8 kg)  12/30/18 154 lb (69.9 kg)     GEN: Healthy in appearance.. No acute distress HEENT: Normal NECK: No JVD. LYMPHATICS: No lymphadenopathy CARDIAC: No murmur. RRR no gallop, or edema. VASCULAR: No normal Pulses. No bruits. RESPIRATORY:  Clear to auscultation without rales, wheezing or rhonchi  ABDOMEN: Soft, non-tender, non-distended, No pulsatile mass, MUSCULOSKELETAL: No deformity  SKIN: Warm and dry NEUROLOGIC:  Alert and oriented x 3 PSYCHIATRIC:  Normal affect   ASSESSMENT:    1. Hyperlipidemia LDL goal <100   2. Erectile dysfunction due to arterial insufficiency   3. Elevated  BP without diagnosis of hypertension   4. Coronary artery disease involving native coronary artery of native heart without angina pectoris    PLAN:    In order of problems listed above:  Target LDL on intermediate intensity rosuvastatin 20 mg/day.  No side effects. Did not discuss. Blood pressure is quite good.  Continue monitoring.  Target 130/80 until he turns 82. Asymptomatic.  Overall education and awareness concerning primary risk prevention was discussed in detail: LDL less than 70, hemoglobin A1c less than 7, blood pressure target less than 130/80 mmHg, >150 minutes of moderate aerobic activity per week, avoidance of smoking, weight control (via diet and exercise), and continued surveillance/management of/for obstructive sleep apnea.    Medication Adjustments/Labs and Tests Ordered: Current medicines are reviewed at length with the patient today.  Concerns regarding medicines are outlined above.  Orders Placed This Encounter  Procedures   EKG 12-Lead    No orders of the defined types were placed in this encounter.   Patient Instructions  Medication Instructions:  Your physician recommends that you continue on your current medications as directed. Please refer to the Current Medication list given to you today.  *If you need a refill on your cardiac medications before your next appointment, please call your pharmacy*   Lab Work: None If you have labs (blood work) drawn today and your tests are completely normal, you will receive your results only by: MyChart Message (if you have MyChart) OR A paper copy in the mail If you have any lab test that is abnormal or we need to change your treatment, we will call you to review the results.   Testing/Procedures: None   Follow-Up: At [facility], you and your health needs are our priority.  As part of our continuing mission to provide you with exceptional heart care, we have created designated Provider Care Teams.  These  Care Teams include your primary Cardiologist (physician) and Advanced Practice Providers (APPs -  Physician Assistants and Nurse Practitioners) who all work together to provide you with the care you need, when you need it.  We recommend signing up for the patient portal called "MyChart".  Sign up information is provided on this After Visit Summary.  MyChart is used to connect with patients for Virtual Visits (Telemedicine).  Patients are able to view lab/test results, encounter notes, upcoming appointments, etc.  Non-urgent messages can be sent to your provider as well.   To learn more about what you can do with MyChart, go to [facility].    Your next appointment:   1 year(s)  The format for your next appointment:   In Person  Provider:   Elwanda Brooklyn. Leia Alf, MD    Other Instructions     Signed, Lesleigh Noe, MD  02/16/2021 2:20 PM    [facility]

## 2021-02-16 ENCOUNTER — Encounter: Payer: Self-pay | Admitting: Interventional Cardiology

## 2021-02-16 ENCOUNTER — Other Ambulatory Visit: Payer: Self-pay

## 2021-02-16 ENCOUNTER — Ambulatory Visit: Payer: Medicare Other | Admitting: Interventional Cardiology

## 2021-02-16 VITALS — BP 122/78 | HR 65 | Ht 67.0 in | Wt 149.6 lb

## 2021-02-16 DIAGNOSIS — R030 Elevated blood-pressure reading, without diagnosis of hypertension: Secondary | ICD-10-CM

## 2021-02-16 DIAGNOSIS — N5201 Erectile dysfunction due to arterial insufficiency: Secondary | ICD-10-CM | POA: Diagnosis not present

## 2021-02-16 DIAGNOSIS — E785 Hyperlipidemia, unspecified: Secondary | ICD-10-CM

## 2021-02-16 DIAGNOSIS — I2510 Atherosclerotic heart disease of native coronary artery without angina pectoris: Secondary | ICD-10-CM | POA: Diagnosis not present

## 2021-02-16 NOTE — Patient Instructions (Signed)
Medication Instructions:  ?Your physician recommends that you continue on your current medications as directed. Please refer to the Current Medication list given to you today. ? ?*If you need a refill on your cardiac medications before your next appointment, please call your pharmacy* ? ? ?Lab Work: ?None ?If you have labs (blood work) drawn today and your tests are completely normal, you will receive your results only by: ?MyChart Message (if you have MyChart) OR ?A paper copy in the mail ?If you have any lab test that is abnormal or we need to change your treatment, we will call you to review the results. ? ? ?Testing/Procedures: ?None ? ? ?Follow-Up: ?At [facility], you and your health needs are our priority.  As part of our continuing mission to provide you with exceptional heart care, we have created designated Provider Care Teams.  These Care Teams include your primary Cardiologist (physician) and Advanced Practice Providers (APPs -  Physician Assistants and Nurse Practitioners) who all work together to provide you with the care you need, when you need it. ? ?We recommend signing up for the patient portal called "MyChart".  Sign up information is provided on this After Visit Summary.  MyChart is used to connect with patients for Virtual Visits (Telemedicine).  Patients are able to view lab/test results, encounter notes, upcoming appointments, etc.  Non-urgent messages can be sent to your provider as well.   ?To learn more about what you can do with MyChart, go to [email or web address].   ? ?Your next appointment:   ?1 year(s) ? ?The format for your next appointment:   ?In Person ? ?Provider:   ?Henry W.B. Smith, III, MD  ? ? ?Other Instructions ?  ?

## 2021-02-22 ENCOUNTER — Other Ambulatory Visit (HOSPITAL_BASED_OUTPATIENT_CLINIC_OR_DEPARTMENT_OTHER): Payer: Self-pay | Admitting: Medical

## 2021-02-22 ENCOUNTER — Ambulatory Visit (HOSPITAL_BASED_OUTPATIENT_CLINIC_OR_DEPARTMENT_OTHER)
Admission: RE | Admit: 2021-02-22 | Discharge: 2021-02-22 | Disposition: A | Discharge status: Home / Self Care | Payer: Medicare Other | Source: Ambulatory Visit | Attending: Medical | Admitting: Medical

## 2021-02-22 ENCOUNTER — Other Ambulatory Visit: Payer: Self-pay

## 2021-02-22 DIAGNOSIS — M79604 Pain in right leg: Secondary | ICD-10-CM | POA: Insufficient documentation

## 2021-02-22 DIAGNOSIS — M79661 Pain in right lower leg: Secondary | ICD-10-CM | POA: Diagnosis not present

## 2021-02-22 DIAGNOSIS — M79662 Pain in left lower leg: Secondary | ICD-10-CM | POA: Diagnosis not present

## 2021-02-22 DIAGNOSIS — M79605 Pain in left leg: Secondary | ICD-10-CM | POA: Diagnosis not present

## 2021-02-24 DIAGNOSIS — M179 Osteoarthritis of knee, unspecified: Secondary | ICD-10-CM | POA: Diagnosis not present

## 2021-03-01 DIAGNOSIS — M79604 Pain in right leg: Secondary | ICD-10-CM | POA: Diagnosis not present

## 2021-03-01 DIAGNOSIS — M25561 Pain in right knee: Secondary | ICD-10-CM | POA: Diagnosis not present

## 2021-04-20 DIAGNOSIS — H348312 Tributary (branch) retinal vein occlusion, right eye, stable: Secondary | ICD-10-CM | POA: Diagnosis not present

## 2021-04-20 DIAGNOSIS — H40013 Open angle with borderline findings, low risk, bilateral: Secondary | ICD-10-CM | POA: Diagnosis not present

## 2021-04-20 DIAGNOSIS — H25813 Combined forms of age-related cataract, bilateral: Secondary | ICD-10-CM | POA: Diagnosis not present

## 2021-05-03 DIAGNOSIS — M179 Osteoarthritis of knee, unspecified: Secondary | ICD-10-CM | POA: Diagnosis not present

## 2021-05-03 DIAGNOSIS — Z0000 Encounter for general adult medical examination without abnormal findings: Secondary | ICD-10-CM | POA: Diagnosis not present

## 2021-05-03 DIAGNOSIS — E7800 Pure hypercholesterolemia, unspecified: Secondary | ICD-10-CM | POA: Diagnosis not present

## 2021-05-03 DIAGNOSIS — K589 Irritable bowel syndrome without diarrhea: Secondary | ICD-10-CM | POA: Diagnosis not present

## 2021-05-03 DIAGNOSIS — R7303 Prediabetes: Secondary | ICD-10-CM | POA: Diagnosis not present

## 2021-05-03 DIAGNOSIS — Z1389 Encounter for screening for other disorder: Secondary | ICD-10-CM | POA: Diagnosis not present

## 2021-05-20 DIAGNOSIS — M25511 Pain in right shoulder: Secondary | ICD-10-CM | POA: Diagnosis not present

## 2021-05-21 DIAGNOSIS — F5101 Primary insomnia: Secondary | ICD-10-CM | POA: Diagnosis not present

## 2021-06-10 DIAGNOSIS — M25511 Pain in right shoulder: Secondary | ICD-10-CM | POA: Diagnosis not present

## 2021-07-26 DIAGNOSIS — M25562 Pain in left knee: Secondary | ICD-10-CM | POA: Diagnosis not present

## 2021-07-26 DIAGNOSIS — M25511 Pain in right shoulder: Secondary | ICD-10-CM | POA: Diagnosis not present

## 2021-09-08 DIAGNOSIS — M25512 Pain in left shoulder: Secondary | ICD-10-CM | POA: Diagnosis not present

## 2021-09-16 ENCOUNTER — Ambulatory Visit: Payer: Medicare Other | Attending: Specialist | Admitting: Physical Therapy

## 2021-09-16 DIAGNOSIS — M6281 Muscle weakness (generalized): Secondary | ICD-10-CM | POA: Insufficient documentation

## 2021-09-16 DIAGNOSIS — R293 Abnormal posture: Secondary | ICD-10-CM | POA: Insufficient documentation

## 2021-09-16 DIAGNOSIS — M542 Cervicalgia: Secondary | ICD-10-CM | POA: Insufficient documentation

## 2021-09-16 NOTE — Therapy (Signed)
OUTPATIENT PHYSICAL THERAPY CERVICAL EVALUATION   Patient Name: Joshua Ellis MRN: [id number] DOB:30-Apr-1950, 71 y.o., male Today's Date: 09/16/2021    Past Medical History:  Diagnosis Date   Hypercholesteremia    Prediabetes    Past Surgical History:  Procedure Laterality Date   BACK SURGERY     JOINT REPLACEMENT     NASAL SEPTOPLASTY W/ TURBINOPLASTY     Patient Active Problem List   Diagnosis Date Noted   Osteoarthritis of left acromioclavicular joint 01/06/2020   Cervical radiculopathy 09/30/2019   Bilateral impacted cerumen 06/16/2017   Chronic nonintractable headache 06/16/2017   Erectile dysfunction 05/18/2017   Pain in joint of right shoulder 04/27/2017   Nasal obstruction 06/28/2012    PCP: Georgann Housekeeper, MD  REFERRING PROVIDER: Jene Every, MD  REFERRING DIAG: M54.2 (ICD-10-CM) - Cervicalgia  THERAPY DIAG:  Cervicalgia  Muscle weakness (generalized)  Abnormal posture  Rationale for Evaluation and Treatment Rehabilitation  ONSET DATE: 1 month ago  SUBJECTIVE:                                                                                                                                                                                                         SUBJECTIVE STATEMENT: Lt sided posterior shoulders into Lt posterior neck. Pt does golf multiple times a week. Pt has had cortisone shot and massage which helped a lot.   PERTINENT HISTORY:  hyperlipidemia,, Prediabetes, total knee replacement, elevated BP without diagnosis of hypertension, erectile dysfunction, and prior history of chest pain (2020).  PAIN:  Are you having pain? Yes: NPRS scale: 5/10 Pain location: Lt neck and shoulder Pain description: tightness, achy  Aggravating factors: supine, turning left, golfing Relieving factors: ice, massage  PRECAUTIONS: None  WEIGHT BEARING RESTRICTIONS No  FALLS:  Has patient fallen in last 6 months? No  LIVING ENVIRONMENT: Lives  with: lives with their family Lives in: House/apartment   OCCUPATION: retired - owned financial services    PLOF: Independent  PATIENT GOALS to have less pain   OBJECTIVE:   DIAGNOSTIC FINDINGS:   PATIENT SURVEYS:  FOTO 38   COGNITION: Overall cognitive status: Within functional limits for tasks assessed   SENSATION: WFL  POSTURE: rounded shoulders and forward head  PALPATION: TTP at Lt C4-7 paraspinals, Lt infraspinatus, Lt supraspinatus, Lt levator, Lt rhomboid    CERVICAL ROM:   Active ROM A/PROM (deg) eval  Flexion 45  Extension 25  Right lateral flexion 30  Left lateral flexion 23  Right rotation 35  Left rotation 25   (Blank rows = not  tested)  UPPER EXTREMITY ROM:  Bil UE WFL  UPPER EXTREMITY MMT:  Bil UE 5/5 throughout  CERVICAL SPECIAL TESTS:  Spurling's test: Negative and Distraction test: Negative   PATIENT SURVEYS:  FOTO 56  TODAY'S TREATMENT:  09/16/2021 Examination completed, findings reviewed, pt educated on POC, HEP. Pt motivated to participate in PT and agreeable to attempt recommendations.     PATIENT EDUCATION:  Education details: Dentist Person educated: Patient Education method: Solicitor, Actor cues, Verbal cues, and Handouts Education comprehension: verbalized understanding and returned demonstration   HOME EXERCISE PROGRAM: [email or web address]  ASSESSMENT:  CLINICAL IMPRESSION: Patient is a 71 y.o. male  who was seen today for physical therapy evaluation and treatment for pain at Lt cervical spine and posterior shoulder. Pt golfs multiple times weekly and very active reports he began having pain while golfing one month ago has seen MD and reports nothing structurally wrong but more musculare. Pt reports pain starts at Lt rhomboid area and travels into Lt side of neck, does have TTP throughout this area see eval for details and does have noted trigger points. Pt does have functional ROM and strength in shoulders  and neck but does have decreased ROM in c-spine, noted above. Pt would benefit from additional PT to improve pain levels and mobility.    OBJECTIVE IMPAIRMENTS decreased ROM, increased fascial restrictions, increased muscle spasms, improper body mechanics, postural dysfunction, and pain.   ACTIVITY LIMITATIONS carrying and lifting  PARTICIPATION LIMITATIONS: community activity  PERSONAL FACTORS Fitness are also affecting patient's functional outcome.   REHAB POTENTIAL: Good  CLINICAL DECISION MAKING: Stable/uncomplicated  EVALUATION COMPLEXITY: Low   GOALS: Goals reviewed with patient? Yes  SHORT TERM GOALS: Target date: 10/14/2021   Pt to be I with HEP.  Baseline:  Goal status: INITIAL  2.  Pt to report no more than 3/10 pain at Lt shoulder/neck with mobility for improved tolerance to golfing.  Baseline: 5/10 Goal status: INITIAL    LONG TERM GOALS: Target date: 11/16/2021  Pt to be I with advanced HEP.  Baseline:  Goal status: INITIAL  2.  Pt to report no more than 1/10 pain at Lt shoulder/neck with mobility for improved tolerance to golfing. Baseline:  Goal status: INITIAL  3.  Pt to demonstrate at least 68 on FOTO for improved function.  Baseline: 56 Goal status: INITIAL    PLAN: PT FREQUENCY: 2x/week  PT DURATION: 6 weeks  PLANNED INTERVENTIONS: Therapeutic exercises, Therapeutic activity, Neuromuscular re-education, Patient/Family education, Joint mobilization, Dry Needling, Spinal manipulation, Spinal mobilization, Cryotherapy, Moist heat, Taping, Ionotophoresis 4mg /ml Dexamethasone, and Manual therapy  PLAN FOR NEXT SESSION: dry needling, manual, stretches  , PT, DPT 06/15/232:01 PM

## 2021-09-22 ENCOUNTER — Ambulatory Visit: Payer: Medicare Other | Admitting: Physical Therapy

## 2021-09-22 DIAGNOSIS — M542 Cervicalgia: Secondary | ICD-10-CM

## 2021-09-22 DIAGNOSIS — R293 Abnormal posture: Secondary | ICD-10-CM | POA: Diagnosis not present

## 2021-09-22 DIAGNOSIS — M6281 Muscle weakness (generalized): Secondary | ICD-10-CM

## 2021-09-22 NOTE — Therapy (Signed)
OUTPATIENT PHYSICAL THERAPY TREATMENT NOTE   Patient Name: Joshua Ellis MRN: [id number] DOB:[birth year], 71 y.o., male Today's Date: 09/22/2021  REFERRING PROVIDER: Jene Every, MD  END OF SESSION:   PT End of Session - 09/22/21 1007     Visit Number 2    Date for PT Re-Evaluation 11/16/21    Authorization Type [employer] Medicare    PT Start Time 1013    PT Stop Time 1056    PT Time Calculation (min) 43 min    Activity Tolerance Patient tolerated treatment well             Past Medical History:  Diagnosis Date   Hypercholesteremia    Prediabetes    Past Surgical History:  Procedure Laterality Date   BACK SURGERY     JOINT REPLACEMENT     NASAL SEPTOPLASTY W/ TURBINOPLASTY     Patient Active Problem List   Diagnosis Date Noted   Osteoarthritis of left acromioclavicular joint 01/06/2020   Cervical radiculopathy 09/30/2019   Bilateral impacted cerumen 06/16/2017   Chronic nonintractable headache 06/16/2017   Erectile dysfunction 05/18/2017   Pain in joint of right shoulder 04/27/2017   Nasal obstruction 06/28/2012    REFERRING DIAG: M54.2 (ICD-10-CM) - Cervicalgia  THERAPY DIAG: Cervicalgia   Muscle weakness (generalized)   Abnormal posture   Rationale for Evaluation and Treatment Rehabilitation  PERTINENT HISTORY:  pain at Lt cervical spine and posterior shoulder. Pt golfs multiple times weekly and very active reports he began having pain while golfing one month ago has seen MD and reports nothing structurally wrong but more musculare. Pt reports pain starts at Lt rhomboid area and travels into Lt side of neck, does have TTP throughout this area   hyperlipidemia,, Prediabetes, total knee replacement, elevated BP without diagnosis of hypertension, erectile dysfunction, and prior history of chest pain (2020).  history of bil rotator cuff surgeries  PRECAUTIONS: none  SUBJECTIVE: Triggered by playing golf.  Massage helps a lot. Had DN in past.    PAIN:   Are you having pain? Yes NPRS scale: 6/10 Pain location: bil neck but left neck , scapula is worst Aggravating factors: golf Relieving factors: massage  OBJECTIVE: (objective measures completed at initial evaluation unless otherwise dated)  OBJECTIVE:    DIAGNOSTIC FINDINGS:    PATIENT SURVEYS:  FOTO 56     COGNITION: Overall cognitive status: Within functional limits for tasks assessed     SENSATION: WFL   POSTURE: rounded shoulders and forward head   PALPATION: TTP at Lt C4-7 paraspinals, Lt infraspinatus, Lt supraspinatus, Lt levator, Lt rhomboid              CERVICAL ROM:    Active ROM A/PROM (deg) eval  Flexion 45  Extension 25  Right lateral flexion 30  Left lateral flexion 23  Right rotation 35  Left rotation 25   (Blank rows = not tested)   UPPER EXTREMITY ROM:   Bil UE WFL   UPPER EXTREMITY MMT:   Bil UE 5/5 throughout   CERVICAL SPECIAL TESTS:  Spurling's test: Negative and Distraction test: Negative     PATIENT SURVEYS:  FOTO 56   TODAY'S TREATMENT:  6/21: Review of initial HEP Instruction in left levator scap stretch 30 sec holds Discussion of ex's to compliment manual therapy and DN Manual therapy:  soft tissue mobilization to bil upper traps, levator scap, subscapularis, teres, rhomboids Trigger Point Dry-Needling  Treatment instructions: Expect mild to moderate muscle soreness. S/S of pneumothorax if  dry needled over a lung field, and to seek immediate medical attention should they occur. Patient verbalized understanding of these instructions and education.  Patient Consent Given: Yes Education handout provided: Yes Muscles treated: bil upper traps, left levator scap, subscapularis, teres, bil cervical multifidi  Treatment response/outcome: numerous twitch responses produced; noted improved soft tissue mobility and scapular dissociation       09/16/2021 Examination completed, findings reviewed, pt educated on POC, HEP. Pt  motivated to participate in PT and agreeable to attempt recommendations.       PATIENT EDUCATION:  Education details: Dentist;  DN info Person educated: Patient Education method: Explanation, Demonstration, Tactile cues, Verbal cues, and Handouts Education comprehension: verbalized understanding and returned demonstration     HOME EXERCISE PROGRAM: Access Code: [email or web address] URL: [email or web address] Date: 09/22/2021 Prepared by: Lavinia Sharps  Exercises - Seated Cervical Sidebending AROM  - 1 x daily - 7 x weekly - 1 sets - 2-3 reps - 30s holds - Seated Cervical Rotation AROM  - 1 x daily - 7 x weekly - 1 sets - 2-3 reps - 30s holds - Seated Cervical Extension AROM  - 1 x daily - 7 x weekly - 1 sets - 2-3 reps - 30-45s holds - Scapular Retraction with Resistance  - 1 x daily - 7 x weekly - 2 sets - 10 reps - Standing Backward Shoulder Rolls  - 1 x daily - 7 x weekly - 2 sets - 10 reps - Seated Levator Scapulae Stretch (Mirrored)  - 1 x daily - 7 x weekly - 1 sets - 3 reps - 30 hold [email or web address]   ASSESSMENT:   CLINICAL IMPRESSION: The patient is receptive to DN of bil cervical and scapular musculature.  Multiple tender points identified particularly in levator scap, upper trap and teres muscles on left.  Much improved soft tissue mobility noted post treatment.  Therapist monitoring response to all interventions and modifying treatment accordingly.       OBJECTIVE IMPAIRMENTS decreased ROM, increased fascial restrictions, increased muscle spasms, improper body mechanics, postural dysfunction, and pain.    ACTIVITY LIMITATIONS carrying and lifting   PARTICIPATION LIMITATIONS: community activity   PERSONAL FACTORS Fitness are also affecting patient's functional outcome.    REHAB POTENTIAL: Good   CLINICAL DECISION MAKING: Stable/uncomplicated   EVALUATION COMPLEXITY: Low     GOALS: Goals reviewed with patient? Yes   SHORT TERM GOALS: Target date: 10/14/2021     Pt to be I with HEP.  Baseline:  Goal status: INITIAL   2.  Pt to report no more than 3/10 pain at Lt shoulder/neck with mobility for improved tolerance to golfing.  Baseline: 5/10 Goal status: INITIAL       LONG TERM GOALS: Target date: 11/16/2021   Pt to be I with advanced HEP.  Baseline:  Goal status: INITIAL   2.  Pt to report no more than 1/10 pain at Lt shoulder/neck with mobility for improved tolerance to golfing. Baseline:  Goal status: INITIAL   3.  Pt to demonstrate at least 68 on FOTO for improved function.  Baseline: 56 Goal status: INITIAL       PLAN: PT FREQUENCY: 2x/week   PT DURATION: 6 weeks   PLANNED INTERVENTIONS: Therapeutic exercises, Therapeutic activity, Neuromuscular re-education, Patient/Family education, Joint mobilization, Dry Needling, Spinal manipulation, Spinal mobilization, Cryotherapy, Moist heat, Taping, Ionotophoresis 4mg /ml Dexamethasone, and Manual therapy   Next session:  assess response to DN#1, add middle and lower trap strengthening to HEP;  recheck sidebending  ROM post treatment  Lavinia Sharps, PT 09/22/21 10:56 AM Phone: [phone] Fax: [phone]

## 2021-09-22 NOTE — Patient Instructions (Signed)

## 2021-09-29 ENCOUNTER — Other Ambulatory Visit: Payer: Self-pay | Admitting: Interventional Cardiology

## 2021-09-29 ENCOUNTER — Ambulatory Visit: Payer: Medicare Other | Admitting: Physical Therapy

## 2021-09-29 DIAGNOSIS — M542 Cervicalgia: Secondary | ICD-10-CM | POA: Diagnosis not present

## 2021-09-29 DIAGNOSIS — M6281 Muscle weakness (generalized): Secondary | ICD-10-CM

## 2021-09-29 DIAGNOSIS — R293 Abnormal posture: Secondary | ICD-10-CM

## 2021-09-29 NOTE — Therapy (Addendum)
OUTPATIENT PHYSICAL THERAPY TREATMENT NOTE/DISCHARGE SUMMARY    Patient Name: Joshua Ellis MRN: [id number] DOB:1950-09-09, 71 y.o., male Today's Date: 09/29/2021  REFERRING PROVIDER: Susa Day, MD  END OF SESSION:   PT End of Session - 09/29/21 0938     Visit Number 3    Date for PT Re-Evaluation 11/16/21    Authorization Type [employer] Medicare    PT Start Time 0935    PT Stop Time 1015    PT Time Calculation (min) 40 min    Activity Tolerance Patient tolerated treatment well             Past Medical History:  Diagnosis Date   Hypercholesteremia    Prediabetes    Past Surgical History:  Procedure Laterality Date   BACK SURGERY     JOINT REPLACEMENT     NASAL SEPTOPLASTY W/ TURBINOPLASTY     Patient Active Problem List   Diagnosis Date Noted   Osteoarthritis of left acromioclavicular joint 01/06/2020   Cervical radiculopathy 09/30/2019   Bilateral impacted cerumen 06/16/2017   Chronic nonintractable headache 06/16/2017   Erectile dysfunction 05/18/2017   Pain in joint of right shoulder 04/27/2017   Nasal obstruction 06/28/2012    REFERRING DIAG: M54.2 (ICD-10-CM) - Cervicalgia  THERAPY DIAG: Cervicalgia   Muscle weakness (generalized)   Abnormal posture   Rationale for Evaluation and Treatment Rehabilitation  PERTINENT HISTORY:  pain at Lt cervical spine and posterior shoulder. Pt golfs multiple times weekly and very active reports he began having pain while golfing one month ago has seen MD and reports nothing structurally wrong but more musculare. Pt reports pain starts at Lt rhomboid area and travels into Lt side of neck, does have TTP throughout this area   hyperlipidemia,, Prediabetes, total knee replacement, elevated BP without diagnosis of hypertension, erectile dysfunction, and prior history of chest pain (2020).  history of bil rotator cuff surgeries  PRECAUTIONS: none  SUBJECTIVE: No changes in pain.  The pain came right back afterwards.  It's tight when I turn to the left.  Still playing golf.  I put ice on it at 3 AM. PAIN:  Are you having pain? Yes NPRS scale: 6/10 with turning head or flexing neck (left side) Pain location: bil neck but left neck , scapula is worst Aggravating factors: golf Relieving factors: massage  OBJECTIVE: (objective measures completed at initial evaluation unless otherwise dated)  OBJECTIVE:    DIAGNOSTIC FINDINGS:    PATIENT SURVEYS:  FOTO 56     COGNITION: Overall cognitive status: Within functional limits for tasks assessed     SENSATION: WFL   POSTURE: rounded shoulders and forward head   PALPATION: TTP at Lt C4-7 paraspinals, Lt infraspinatus, Lt supraspinatus, Lt levator, Lt rhomboid              CERVICAL ROM:    Active ROM A/PROM (deg) eval  Flexion 45  Extension 25  Right lateral flexion 30  Left lateral flexion 23  Right rotation 35  Left rotation 25   (Blank rows = not tested)   UPPER EXTREMITY ROM:   Bil UE WFL   UPPER EXTREMITY MMT:   Bil UE 5/5 throughout   CERVICAL SPECIAL TESTS:  Spurling's test: Negative and Distraction test: Negative     PATIENT SURVEYS:  FOTO 56   TODAY'S TREATMENT:  6/28: Discussion of ex's to compliment manual therapy and DN for best long term benefit Golf or lacrosse ball pinned against wall and levator scap trigger point with  cervical flexion, rotation and combo movements Theracane trigger point release with cervical ROM Manual therapy:  soft tissue mobilization to bil upper traps, levator scap, cervical paraspinals  Trigger Point Dry-Needling  Treatment instructions: Expect mild to moderate muscle soreness. S/S of pneumothorax if dry needled over a lung field, and to seek immediate medical attention should they occur. Patient verbalized understanding of these instructions and education.  Patient Consent Given: Yes Education handout provided: Yes Muscles treated: bil upper traps, bil levator scap, bil cervical  multifidi  Treatment response/outcome: numerous twitch responses produced; noted improved soft tissue mobility       6/21: Review of initial HEP Instruction in left levator scap stretch 30 sec holds Discussion of ex's to compliment manual therapy and DN Manual therapy:  soft tissue mobilization to bil upper traps, levator scap, subscapularis, teres, rhomboids Trigger Point Dry-Needling  Treatment instructions: Expect mild to moderate muscle soreness. S/S of pneumothorax if dry needled over a lung field, and to seek immediate medical attention should they occur. Patient verbalized understanding of these instructions and education.  Patient Consent Given: Yes Education handout provided: Yes Muscles treated: bil upper traps, left levator scap, subscapularis, teres, bil cervical multifidi; added ES to DN left cervical multifidi 5 min intensity to tolerance   Treatment response/outcome: numerous twitch responses produced; noted improved soft tissue mobility and scapular dissociation       09/16/2021 Examination completed, findings reviewed, pt educated on POC, HEP. Pt motivated to participate in PT and agreeable to attempt recommendations.       PATIENT EDUCATION:  Education details: Investment banker, corporate;  DN info Person educated: Patient Education method: Explanation, Demonstration, Tactile cues, Verbal cues, and Handouts Education comprehension: verbalized understanding and returned demonstration     HOME EXERCISE PROGRAM: Access Code: [email or web address] URL: [email or web address] Date: 09/22/2021 Prepared by: Ruben Im  Exercises - Seated Cervical Sidebending AROM  - 1 x daily - 7 x weekly - 1 sets - 2-3 reps - 30s holds - Seated Cervical Rotation AROM  - 1 x daily - 7 x weekly - 1 sets - 2-3 reps - 30s holds - Seated Cervical Extension AROM  - 1 x daily - 7 x weekly - 1 sets - 2-3 reps - 30-45s holds - Scapular Retraction with Resistance  - 1 x daily - 7 x weekly - 2 sets - 10  reps - Standing Backward Shoulder Rolls  - 1 x daily - 7 x weekly - 2 sets - 10 reps - Seated Levator Scapulae Stretch (Mirrored)  - 1 x daily - 7 x weekly - 1 sets - 3 reps - 30 hold [email or web address]   ASSESSMENT:   CLINICAL IMPRESSION: The patient reports only short term relief from initial DN session.  His pain is more localized to left levator scap and trap today vs. Interscapular region as present last visit.  He has several  trigger points with largest in left levator scap muscle.  He is highly sensitive to DN of this point.  We discussed self trigger point release strategies for home.  Therapist modifying treatment based on symptoms and response to treatment.          OBJECTIVE IMPAIRMENTS decreased ROM, increased fascial restrictions, increased muscle spasms, improper body mechanics, postural dysfunction, and pain.    ACTIVITY LIMITATIONS carrying and lifting   PARTICIPATION LIMITATIONS: community activity   PERSONAL FACTORS Fitness are also affecting patient's functional outcome.    REHAB POTENTIAL: Good   CLINICAL DECISION MAKING: Stable/uncomplicated   EVALUATION COMPLEXITY:  Low     GOALS: Goals reviewed with patient? Yes   SHORT TERM GOALS: Target date: 10/14/2021    Pt to be I with HEP.  Baseline:  Goal status: INITIAL   2.  Pt to report no more than 3/10 pain at Lt shoulder/neck with mobility for improved tolerance to golfing.  Baseline: 5/10 Goal status: INITIAL       LONG TERM GOALS: Target date: 11/16/2021   Pt to be I with advanced HEP.  Baseline:  Goal status: INITIAL   2.  Pt to report no more than 1/10 pain at Lt shoulder/neck with mobility for improved tolerance to golfing. Baseline:  Goal status: INITIAL   3.  Pt to demonstrate at least 42 on FOTO for improved function.  Baseline: 56 Goal status: INITIAL       PLAN: PT FREQUENCY: 2x/week   PT DURATION: 6 weeks   PLANNED INTERVENTIONS: Therapeutic exercises, Therapeutic activity,  Neuromuscular re-education, Patient/Family education, Joint mobilization, Dry Needling, Spinal manipulation, Spinal mobilization, Cryotherapy, Moist heat, Taping, Ionotophoresis 47m/ml Dexamethasone, and Manual therapy   Next session:  assess response to DN#2 with added ES to left, levator trigger point release; add middle and lower trap strengthening to HEP;  recheck sidebending ROM post treatment  SRuben Im PT 09/29/21 1:42 PM Phone: [phone]: [phone]   PHYSICAL THERAPY DISCHARGE SUMMARY  Visits from Start of Care: 3  Current functional level related to goals / functional outcomes: The patient cancelled his last 2 PT appts and has not returned.     Remaining deficits: As above   Education / Equipment: Initial HEP   Patient agrees to discharge. Patient goals were not met. Patient is being discharged due to not returning since the last visit.  SRuben Im PT 11/23/21 12:51 PM Phone: [phone]: [phone]

## 2021-10-06 DIAGNOSIS — M542 Cervicalgia: Secondary | ICD-10-CM | POA: Diagnosis not present

## 2021-10-06 DIAGNOSIS — M549 Dorsalgia, unspecified: Secondary | ICD-10-CM | POA: Diagnosis not present

## 2021-10-12 ENCOUNTER — Ambulatory Visit: Payer: Medicare Other | Admitting: Physical Therapy

## 2021-10-12 ENCOUNTER — Encounter: Payer: Medicare Other | Admitting: Physical Therapy

## 2021-10-14 DIAGNOSIS — M549 Dorsalgia, unspecified: Secondary | ICD-10-CM | POA: Diagnosis not present

## 2021-10-14 DIAGNOSIS — M542 Cervicalgia: Secondary | ICD-10-CM | POA: Diagnosis not present

## 2021-10-26 DIAGNOSIS — M542 Cervicalgia: Secondary | ICD-10-CM | POA: Diagnosis not present

## 2021-10-26 DIAGNOSIS — M549 Dorsalgia, unspecified: Secondary | ICD-10-CM | POA: Diagnosis not present

## 2021-11-08 DIAGNOSIS — R0981 Nasal congestion: Secondary | ICD-10-CM | POA: Diagnosis not present

## 2021-11-08 DIAGNOSIS — Z03818 Encounter for observation for suspected exposure to other biological agents ruled out: Secondary | ICD-10-CM | POA: Diagnosis not present

## 2021-11-17 DIAGNOSIS — R0683 Snoring: Secondary | ICD-10-CM | POA: Diagnosis not present

## 2021-11-17 DIAGNOSIS — Z9889 Other specified postprocedural states: Secondary | ICD-10-CM | POA: Diagnosis not present

## 2021-11-17 DIAGNOSIS — R0981 Nasal congestion: Secondary | ICD-10-CM | POA: Diagnosis not present

## 2021-11-17 DIAGNOSIS — Z79899 Other long term (current) drug therapy: Secondary | ICD-10-CM | POA: Diagnosis not present

## 2021-11-29 DIAGNOSIS — M25552 Pain in left hip: Secondary | ICD-10-CM | POA: Diagnosis not present

## 2021-12-09 DIAGNOSIS — L239 Allergic contact dermatitis, unspecified cause: Secondary | ICD-10-CM | POA: Diagnosis not present

## 2021-12-23 ENCOUNTER — Other Ambulatory Visit: Payer: Self-pay | Admitting: Interventional Cardiology

## 2022-01-03 DIAGNOSIS — A084 Viral intestinal infection, unspecified: Secondary | ICD-10-CM | POA: Diagnosis not present

## 2022-01-04 DIAGNOSIS — R030 Elevated blood-pressure reading, without diagnosis of hypertension: Secondary | ICD-10-CM | POA: Diagnosis not present

## 2022-01-12 DIAGNOSIS — H25813 Combined forms of age-related cataract, bilateral: Secondary | ICD-10-CM | POA: Diagnosis not present

## 2022-01-12 DIAGNOSIS — H40013 Open angle with borderline findings, low risk, bilateral: Secondary | ICD-10-CM | POA: Diagnosis not present

## 2022-01-12 DIAGNOSIS — H348312 Tributary (branch) retinal vein occlusion, right eye, stable: Secondary | ICD-10-CM | POA: Diagnosis not present

## 2022-01-12 DIAGNOSIS — H04123 Dry eye syndrome of bilateral lacrimal glands: Secondary | ICD-10-CM | POA: Diagnosis not present

## 2022-01-13 DIAGNOSIS — M7541 Impingement syndrome of right shoulder: Secondary | ICD-10-CM | POA: Diagnosis not present

## 2022-01-26 DIAGNOSIS — H903 Sensorineural hearing loss, bilateral: Secondary | ICD-10-CM | POA: Diagnosis not present

## 2022-01-27 DIAGNOSIS — H9190 Unspecified hearing loss, unspecified ear: Secondary | ICD-10-CM | POA: Diagnosis not present

## 2022-01-27 DIAGNOSIS — R030 Elevated blood-pressure reading, without diagnosis of hypertension: Secondary | ICD-10-CM | POA: Diagnosis not present

## 2022-01-31 DIAGNOSIS — M25551 Pain in right hip: Secondary | ICD-10-CM | POA: Diagnosis not present

## 2022-02-07 DIAGNOSIS — M25551 Pain in right hip: Secondary | ICD-10-CM | POA: Diagnosis not present

## 2022-02-14 DIAGNOSIS — M5451 Vertebrogenic low back pain: Secondary | ICD-10-CM | POA: Diagnosis not present

## 2022-02-24 ENCOUNTER — Other Ambulatory Visit: Payer: Self-pay | Admitting: Interventional Cardiology

## 2022-04-05 ENCOUNTER — Telehealth: Payer: Self-pay | Admitting: Interventional Cardiology

## 2022-04-05 DIAGNOSIS — Z79899 Other long term (current) drug therapy: Secondary | ICD-10-CM

## 2022-04-05 DIAGNOSIS — E785 Hyperlipidemia, unspecified: Secondary | ICD-10-CM

## 2022-04-05 NOTE — Progress Notes (Signed)
Cardiology Office Note:    Date:  04/12/2022   ID:  Joshua Ellis, DOB April 18, 1950, MRN [id number]  PCP:  Wenda Low, MD  [facility] Cardiologist:  Candee Furbish, MD     Referring MD: Wenda Low, MD   Chief Complaint:  Follow-up and Hypertension     History of Present Illness:   Joshua Ellis is a 72 y.o. male with a hx of hyperlipidemia,, Prediabetes, total knee replacement, elevated BP without diagnosis of hypertension, erectile dysfunction, and prior history of chest pain (2020). Coronary Calcium score Jun 13, 2020 30.  Father died of MI 70's-obese and diabetic.  Last saw Dr. Tamala Julian 02/2021 and doing well.  Patient comes in for f/u. BP running high 140-150/70-80's. Watches his salt closely. Golfs 3 days a week and walks the course. Denies chest pain, dyspnea, palpitations, edema.           Past Medical History:  Diagnosis Date   Hypercholesteremia    Prediabetes    Current Medications: Current Meds  Medication Sig   Ascorbic Acid (VITAMIN C PO) Take 1 capsule by mouth daily.   Cyanocobalamin (VITAMIN B-12 PO) Take 1 tablet by mouth daily.   ELDERBERRY PO Take by mouth daily.   fluticasone (FLONASE) 50 MCG/ACT nasal spray Place 2 sprays into both nostrils daily. Takes PRN   Garlic 725 MG TABS Take 100 mg by mouth daily.   Krill Oil Omega-3 500 MG CAPS Take 500 mg by mouth daily.   losartan (COZAAR) 25 MG tablet Take 1 tablet (25 mg total) by mouth daily.   [DISCONTINUED] rosuvastatin (CRESTOR) 20 MG tablet Take 1 tablet (20 mg total) by mouth daily. Please schedule yearly appointment for future refills. Thank you    Allergies:   Patient has no known allergies.   Social History   Tobacco Use   Smoking status: Never   Smokeless tobacco: Never  Substance Use Topics   Alcohol use: Never   Drug use: Never    Family Hx: The patient's family history includes CAD in his father; Dementia in his mother; Diabetes in his father.  ROS      Physical Exam:    VS:  BP (!) 150/80   Pulse (!) 54   Ht 5\' 7"  (1.702 m)   Wt 156 lb (70.8 kg)   SpO2 98%   BMI 24.43 kg/m     Wt Readings from Last 3 Encounters:  04/12/22 156 lb (70.8 kg)  02/16/21 149 lb 9.6 oz (67.9 kg)  05/28/20 156 lb (70.8 kg)    Physical Exam  GEN: Thin, in no acute distress  Neck: no JVD, carotid bruits, or masses Cardiac:RRR some skipping; no murmurs, rubs, or gallops  Respiratory:  clear to auscultation bilaterally, normal work of breathing GI: soft, nontender, nondistended, + BS Ext: without cyanosis, clubbing, or edema, Good distal pulses bilaterally Neuro:  Alert and Oriented x 3,  Psych: euthymic mood, full affect        EKGs/Labs/Other Test Reviewed:    EKG:  EKG is  ordered today.  The ekg ordered today demonstrates sinus bradycardia 54/m normal EKG  Recent Labs: 04/07/2022: ALT 23   Recent Lipid Panel Recent Labs    04/07/22 0739  CHOL 143  TRIG 77  HDL 57  LDLCALC 71     Prior CV Studies:     CORONARY CALCIUM SCORE June 13, 2020: IMPRESSION: Coronary calcium score of 30. This was 16 percentile for age and sex matched control.  Risk Assessment/Calculations/Metrics:  HYPERTENSION CONTROL Vitals:   04/12/22 0813 04/12/22 0841  BP: (!) 150/80 (!) 150/80    The patient's blood pressure is elevated above target today.  In order to address the patient's elevated BP: Blood pressure will be monitored at home to determine if medication changes need to be made.; A new medication was prescribed today.; Follow up with primary care provider for management.       ASSESSMENT & PLAN:   No problem-specific Assessment & Plan notes found for this encounter.   HTN-BP elevated and running high at home. Will start low dose losartan 25 mg daily. Check bmet today and due for lab work with PCP next month.  HLD lipid panel 04/07/22 LDL 71 on Crestor 20 mg daily.  CAD-Elevated coronary calcium score 30 2022 on statin no  symptoms.                Dispo:  No follow-ups on file.   Medication Adjustments/Labs and Tests Ordered: Current medicines are reviewed at length with the patient today.  Concerns regarding medicines are outlined above.  Tests Ordered: Orders Placed This Encounter  Procedures   Basic metabolic panel   EKG 08-QPYP   Medication Changes: Meds ordered this encounter  Medications   rosuvastatin (CRESTOR) 20 MG tablet    Sig: Take 1 tablet (20 mg total) by mouth daily.    Dispense:  90 tablet    Refill:  3   losartan (COZAAR) 25 MG tablet    Sig: Take 1 tablet (25 mg total) by mouth daily.    Dispense:  90 tablet    Refill:  3   Signed, Ermalinda Barrios, PA-C  04/12/2022 8:46 AM    [address], [place], [place]  [address] Phone: [phone]; Fax: [phone]

## 2022-04-05 NOTE — Telephone Encounter (Signed)
Patient called to request orders for lab work to be done prior to his annual visit on 1/9.

## 2022-04-06 NOTE — Telephone Encounter (Signed)
Returned call to patient.  Patient states he usually has labs performed prior to his annual visits to check his cholesterol.  Last Lipid panel checked January 2023.  Lipid panel and LFTs ordered for annual follow-up. Patient has appointment with Joshua Barrios, PA-C on 04/12/2022. Patient will have fasting labs 04/07/2022.  Patient also requested recommendation on who he should follow-up with upon [name] retirement. Provided names of 3 providers recommended by Dr. Tamala Julian: Joshua Furbish, MD or Joshua Haskell, MD or Joshua Kaufman, MD.   Patient verbalized understanding and expressed appreciation for assistance.

## 2022-04-07 ENCOUNTER — Ambulatory Visit: Payer: Medicare Other | Attending: Interventional Cardiology

## 2022-04-07 DIAGNOSIS — Z79899 Other long term (current) drug therapy: Secondary | ICD-10-CM

## 2022-04-07 DIAGNOSIS — E785 Hyperlipidemia, unspecified: Secondary | ICD-10-CM

## 2022-04-07 LAB — HEPATIC FUNCTION PANEL
ALT: 23 IU/L (ref 0–44)
AST: 25 IU/L (ref 0–40)
Albumin: 4.4 g/dL (ref 3.9–4.9)
Alkaline Phosphatase: 65 IU/L (ref 44–121)
Bilirubin Total: 0.5 mg/dL (ref 0.0–1.2)
Bilirubin, Direct: 0.15 mg/dL (ref 0.00–0.40)
Total Protein: 6.4 g/dL (ref 6.0–8.5)

## 2022-04-07 LAB — LIPID PANEL
Chol/HDL Ratio: 2.5 ratio (ref 0.0–5.0)
Cholesterol, Total: 143 mg/dL (ref 100–199)
HDL: 57 mg/dL (ref >39)
LDL Chol Calc (NIH): 71 mg/dL (ref 0–99)
Triglycerides: 77 mg/dL (ref 0–149)
VLDL Cholesterol Cal: 15 mg/dL (ref 5–40)

## 2022-04-11 DIAGNOSIS — H903 Sensorineural hearing loss, bilateral: Secondary | ICD-10-CM | POA: Diagnosis not present

## 2022-04-12 ENCOUNTER — Encounter: Payer: Self-pay | Admitting: Physician Assistant

## 2022-04-12 ENCOUNTER — Other Ambulatory Visit: Payer: Self-pay | Admitting: Physician Assistant

## 2022-04-12 ENCOUNTER — Ambulatory Visit: Payer: Medicare Other | Attending: Physician Assistant | Admitting: Physician Assistant

## 2022-04-12 VITALS — BP 150/80 | HR 54 | Ht 67.0 in | Wt 156.0 lb

## 2022-04-12 DIAGNOSIS — E785 Hyperlipidemia, unspecified: Secondary | ICD-10-CM

## 2022-04-12 DIAGNOSIS — I10 Essential (primary) hypertension: Secondary | ICD-10-CM | POA: Diagnosis not present

## 2022-04-12 DIAGNOSIS — Z79899 Other long term (current) drug therapy: Secondary | ICD-10-CM

## 2022-04-12 DIAGNOSIS — I2510 Atherosclerotic heart disease of native coronary artery without angina pectoris: Secondary | ICD-10-CM

## 2022-04-12 LAB — BASIC METABOLIC PANEL
BUN/Creatinine Ratio: 14 (ref 10–24)
BUN: 11 mg/dL (ref 8–27)
CO2: 25 mmol/L (ref 20–29)
Calcium: 9.4 mg/dL (ref 8.6–10.2)
Chloride: 103 mmol/L (ref 96–106)
Creatinine, Ser: 0.77 mg/dL (ref 0.76–1.27)
Glucose: 98 mg/dL (ref 70–99)
Potassium: 4.6 mmol/L (ref 3.5–5.2)
Sodium: 142 mmol/L (ref 134–144)
eGFR: 97 mL/min/{1.73_m2} (ref >59)

## 2022-04-12 MED ORDER — ROSUVASTATIN CALCIUM 20 MG PO TABS: 20.0000 mg | ORAL_TABLET | Freq: Every day | ORAL | 3 refills | Status: DC

## 2022-04-12 MED ORDER — LOSARTAN POTASSIUM 25 MG PO TABS: 25.0000 mg | ORAL_TABLET | Freq: Every day | ORAL | 3 refills | Status: DC

## 2022-04-12 NOTE — Patient Instructions (Signed)
Medication Instructions:  1.Start losartan 25 mg daily *If you need a refill on your cardiac medications before your next appointment, please call your pharmacy*   Lab Work: BMET today If you have labs (blood work) drawn today and your tests are completely normal, you will receive your results only by: [facility] (if you have MyChart) OR A paper copy in the mail If you have any lab test that is abnormal or we need to change your treatment, we will call you to review the results.   Follow-Up: At [facility], you and your health needs are our priority.  As part of our continuing mission to provide you with exceptional heart care, we have created designated Provider Care Teams.  These Care Teams include your primary Cardiologist (physician) and Advanced Practice Providers (APPs -  Physician Assistants and Nurse Practitioners) who all work together to provide you with the care you need, when you need it.   Your next appointment:   1 year(s)  The format for your next appointment:   In Person  Provider:   Dr Marlou Porch  Important Information About Sugar

## 2022-04-13 ENCOUNTER — Other Ambulatory Visit: Payer: Self-pay

## 2022-04-13 MED ORDER — LOSARTAN POTASSIUM 25 MG PO TABS: 25.0000 mg | ORAL_TABLET | Freq: Every day | ORAL | 3 refills | Status: DC

## 2022-05-20 DIAGNOSIS — I499 Cardiac arrhythmia, unspecified: Secondary | ICD-10-CM | POA: Diagnosis not present

## 2022-05-20 DIAGNOSIS — E7800 Pure hypercholesterolemia, unspecified: Secondary | ICD-10-CM | POA: Diagnosis not present

## 2022-05-20 DIAGNOSIS — R7303 Prediabetes: Secondary | ICD-10-CM | POA: Diagnosis not present

## 2022-05-24 ENCOUNTER — Telehealth: Payer: Self-pay | Admitting: Physician Assistant

## 2022-05-24 DIAGNOSIS — I491 Atrial premature depolarization: Secondary | ICD-10-CM | POA: Diagnosis not present

## 2022-05-24 DIAGNOSIS — J309 Allergic rhinitis, unspecified: Secondary | ICD-10-CM | POA: Diagnosis not present

## 2022-05-24 DIAGNOSIS — Z0000 Encounter for general adult medical examination without abnormal findings: Secondary | ICD-10-CM | POA: Diagnosis not present

## 2022-05-24 DIAGNOSIS — M1990 Unspecified osteoarthritis, unspecified site: Secondary | ICD-10-CM | POA: Diagnosis not present

## 2022-05-24 DIAGNOSIS — E7800 Pure hypercholesterolemia, unspecified: Secondary | ICD-10-CM | POA: Diagnosis not present

## 2022-05-24 DIAGNOSIS — R7303 Prediabetes: Secondary | ICD-10-CM | POA: Diagnosis not present

## 2022-05-24 DIAGNOSIS — Z1389 Encounter for screening for other disorder: Secondary | ICD-10-CM | POA: Diagnosis not present

## 2022-05-24 DIAGNOSIS — I10 Essential (primary) hypertension: Secondary | ICD-10-CM | POA: Diagnosis not present

## 2022-05-24 NOTE — Telephone Encounter (Signed)
Left message on return call and advised patient I had forwarded the information he requested to his PCP, Dr Lysle Rubens.

## 2022-05-24 NOTE — Telephone Encounter (Signed)
Patient called stating he would like his last OV notes and EKG sent over to his PCP Dr. Wenda Low.

## 2022-06-11 DIAGNOSIS — J411 Mucopurulent chronic bronchitis: Secondary | ICD-10-CM | POA: Diagnosis not present

## 2022-06-14 ENCOUNTER — Other Ambulatory Visit: Payer: Self-pay

## 2022-06-14 MED ORDER — ROSUVASTATIN CALCIUM 20 MG PO TABS: 20.0000 mg | ORAL_TABLET | Freq: Every day | ORAL | 3 refills | Status: DC

## 2022-06-15 DIAGNOSIS — M5451 Vertebrogenic low back pain: Secondary | ICD-10-CM | POA: Diagnosis not present

## 2022-06-23 ENCOUNTER — Other Ambulatory Visit: Payer: Self-pay

## 2022-06-23 MED ORDER — LOSARTAN POTASSIUM 25 MG PO TABS: 25.0000 mg | ORAL_TABLET | Freq: Every day | ORAL | 3 refills | Status: DC

## 2022-07-25 DIAGNOSIS — M25561 Pain in right knee: Secondary | ICD-10-CM | POA: Diagnosis not present

## 2022-08-01 DIAGNOSIS — H16141 Punctate keratitis, right eye: Secondary | ICD-10-CM | POA: Diagnosis not present

## 2022-08-11 DIAGNOSIS — M25561 Pain in right knee: Secondary | ICD-10-CM | POA: Diagnosis not present

## 2022-08-11 DIAGNOSIS — R269 Unspecified abnormalities of gait and mobility: Secondary | ICD-10-CM | POA: Diagnosis not present

## 2022-08-16 DIAGNOSIS — M25561 Pain in right knee: Secondary | ICD-10-CM | POA: Diagnosis not present

## 2022-08-17 IMAGING — DX DG FEMUR 2+V*L*
3 series · 3 of 3 positions shown · non-contrast
Comparison: 01/04/2016

CLINICAL DATA: Left leg pain

EXAM:
DG HIP (WITH OR WITHOUT PELVIS) 2-3V LEFT; LUMBAR SPINE - 2-3 VIEW;
LEFT FEMUR 2 VIEWS

[dg femur min 2 views left (1 of 3)]
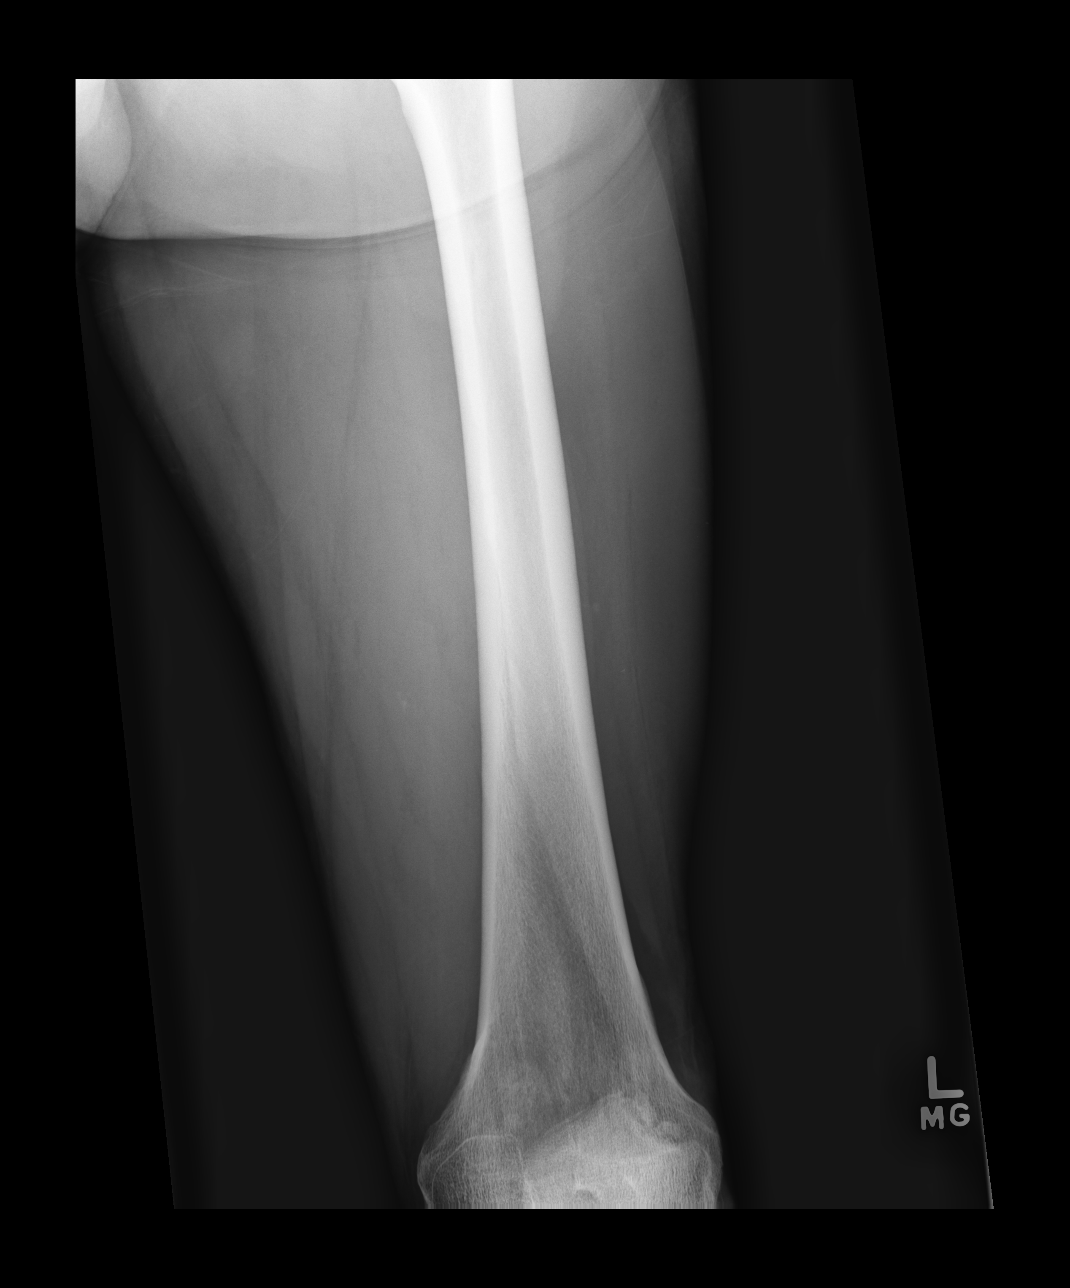

[dg femur min 2 views left (2 of 3)]
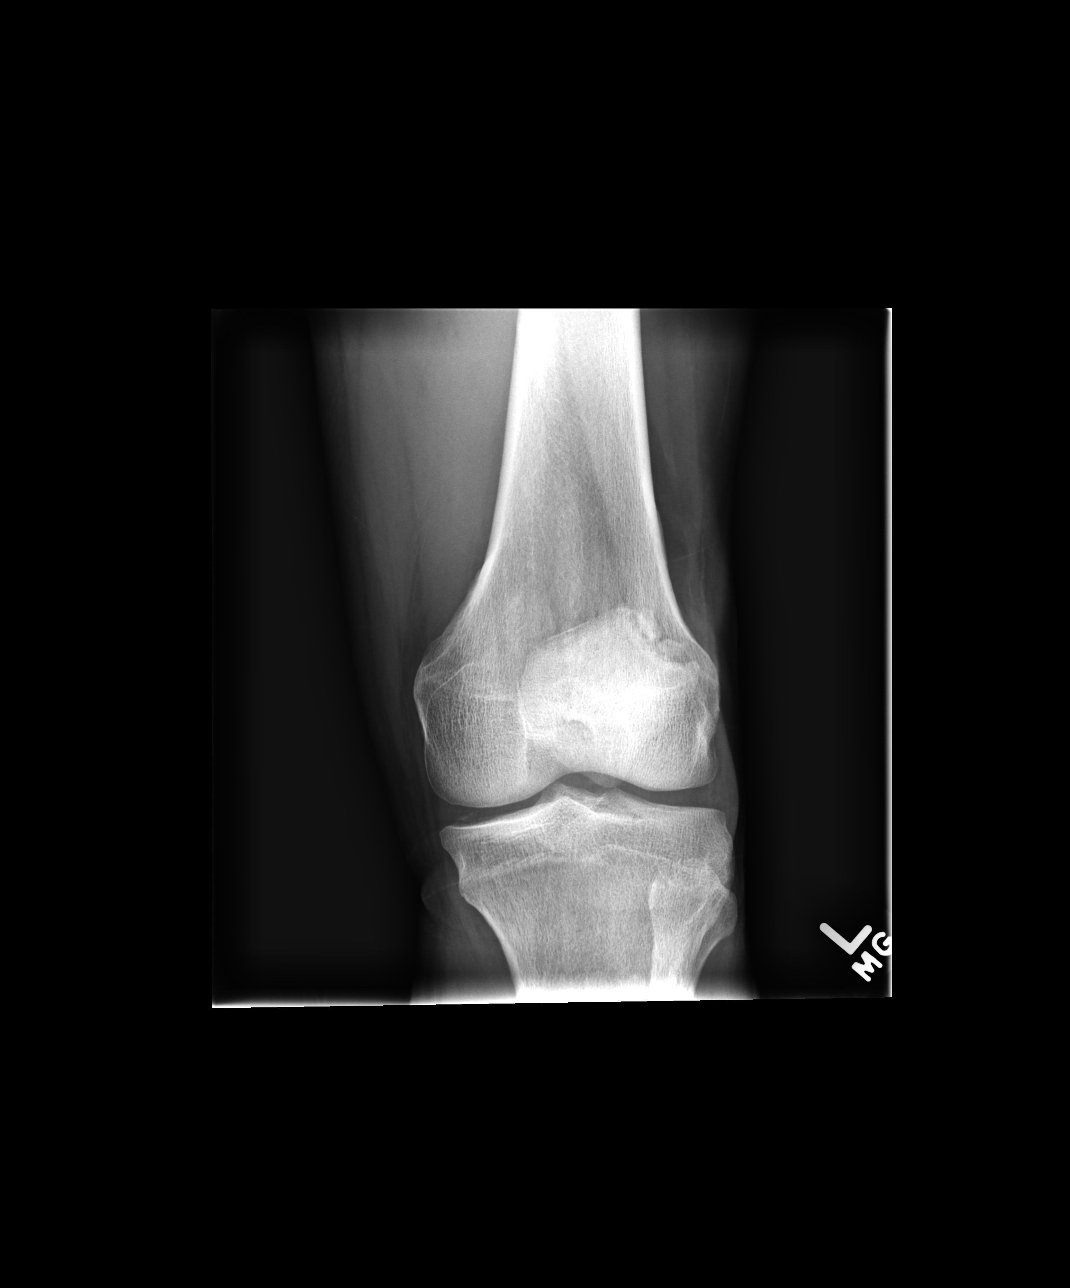

[dg femur min 2 views left (3 of 3)]
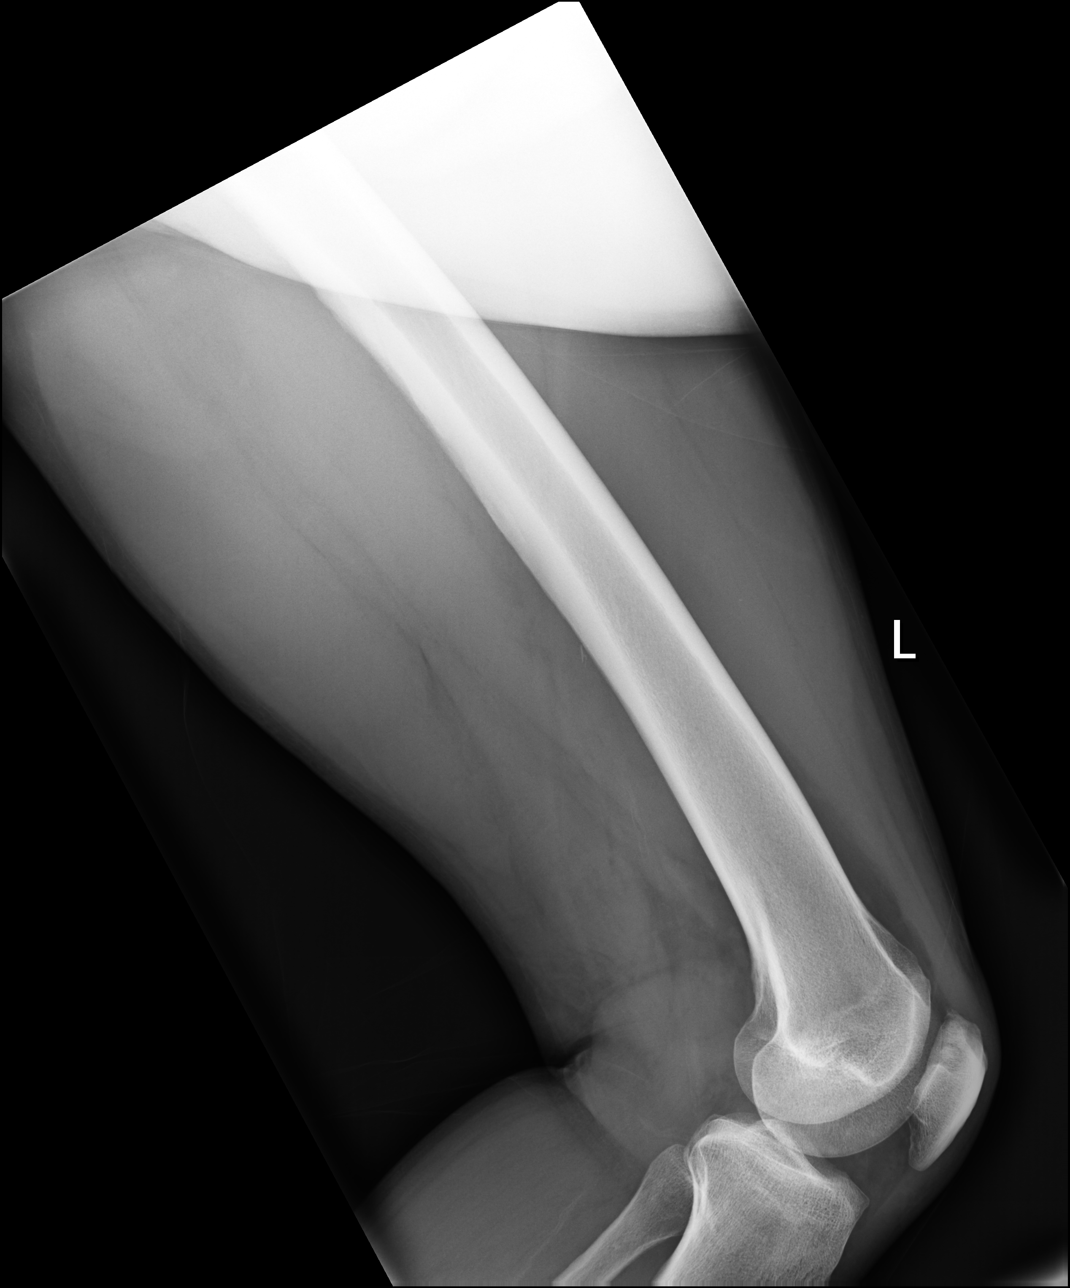

[3 of 3 positions shown; findings below may reference images not displayed]

FINDINGS: Lumbar spine: Frontal and lateral views of the lumbar spine
demonstrate 5 non-rib-bearing lumbar type vertebral bodies in
grossly normal alignment. Diffuse lumbar spondylosis greatest at
L4/L5, slightly progressive since prior study. Multilevel facet
hypertrophic changes greatest at L4-5 and L5-S1. No acute fractures.
Sacroiliac joints are normal.

Left hip: Frontal view of the pelvis as well as frontal and frogleg
lateral views of the left hip are obtained. No fracture,
subluxation, or dislocation. Moderate bilateral hip osteoarthritis,
right greater than left. Soft tissues are unremarkable.

Left femur: Frontal and lateral views demonstrate no fractures. Mild
osteoarthritis of the left knee. Soft tissues are unremarkable.
IMPRESSION: 1. Progressive multilevel lumbar spondylosis and facet hypertrophy
greatest at L4/L5.
2. Bilateral hip osteoarthritis, right greater than left.
3. No acute or destructive bony lesions.

## 2022-08-17 IMAGING — DX DG HIP (WITH OR WITHOUT PELVIS) 2-3V*L*
3 series · 3 of 3 positions shown · non-contrast
Comparison: 01/04/2016

CLINICAL DATA: Left leg pain

EXAM:
DG HIP (WITH OR WITHOUT PELVIS) 2-3V LEFT; LUMBAR SPINE - 2-3 VIEW;
LEFT FEMUR 2 VIEWS

[dg hip unilat w or w/o pelvis 2-3 views  (1 of 3)]
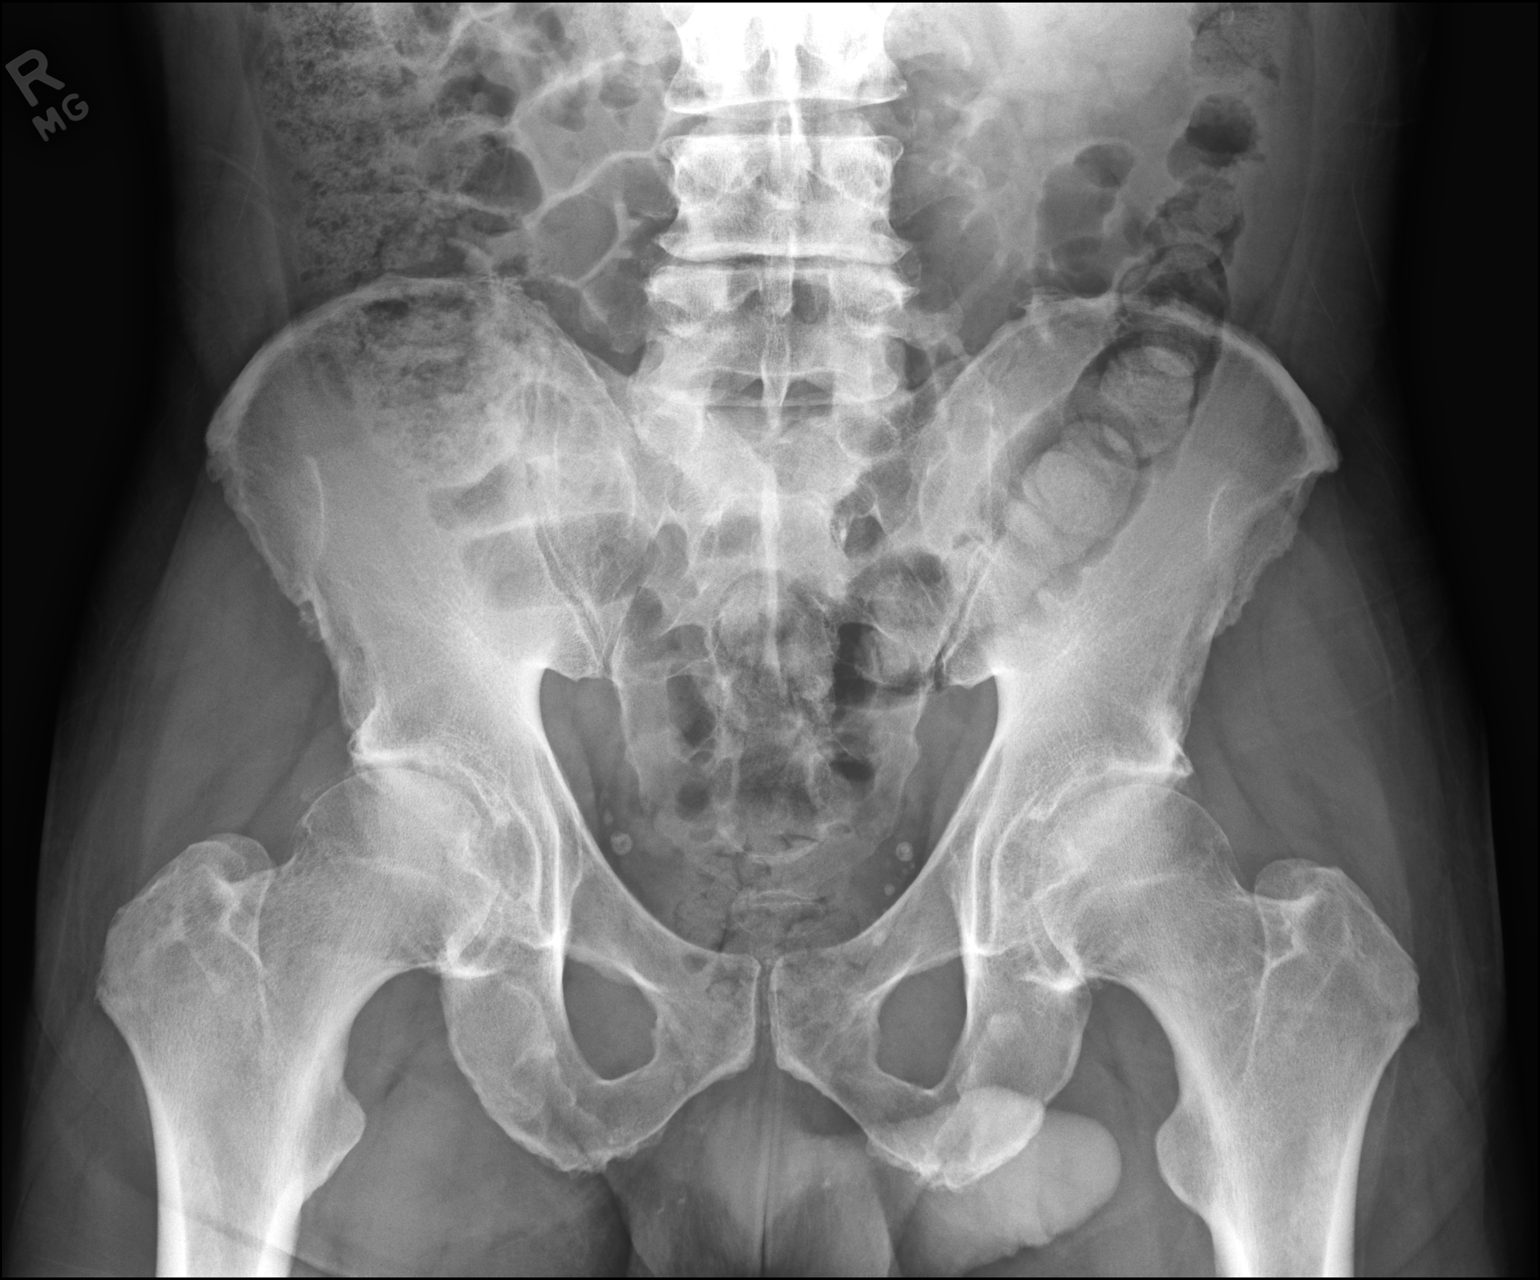

[dg hip unilat w or w/o pelvis 2-3 views  (2 of 3)]
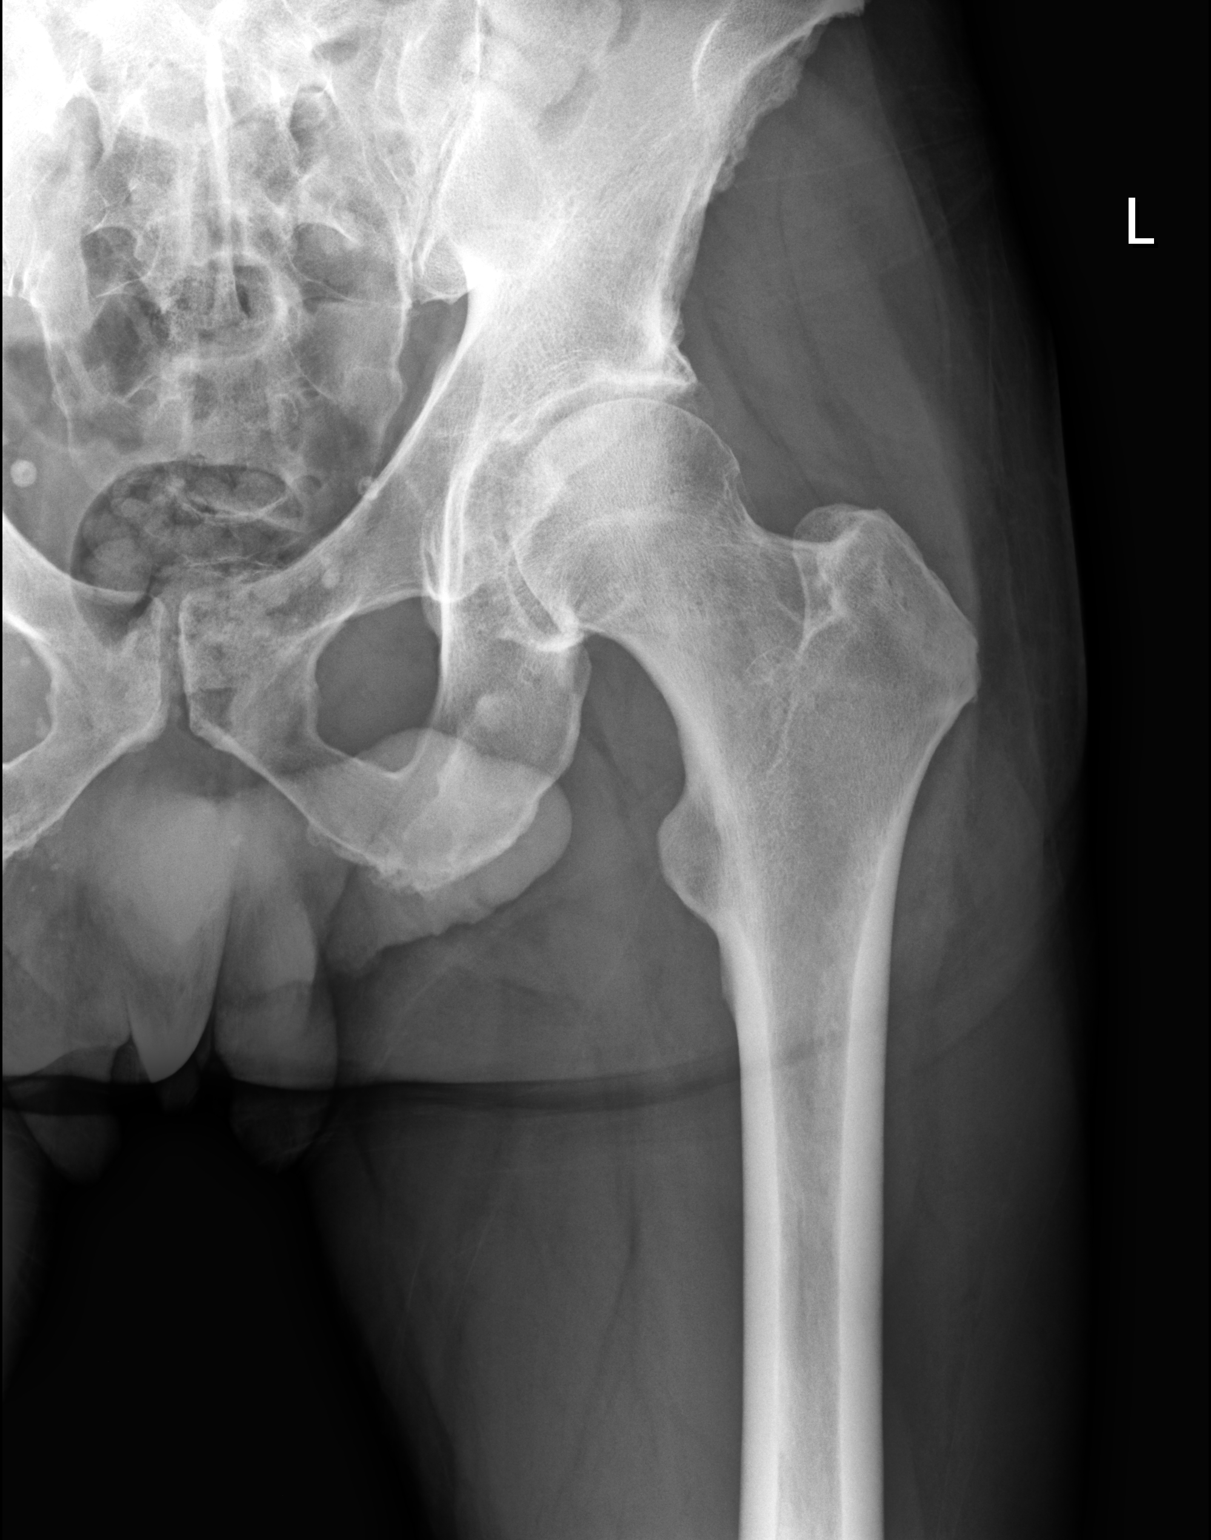

[dg hip unilat w or w/o pelvis 2-3 views  (3 of 3)]
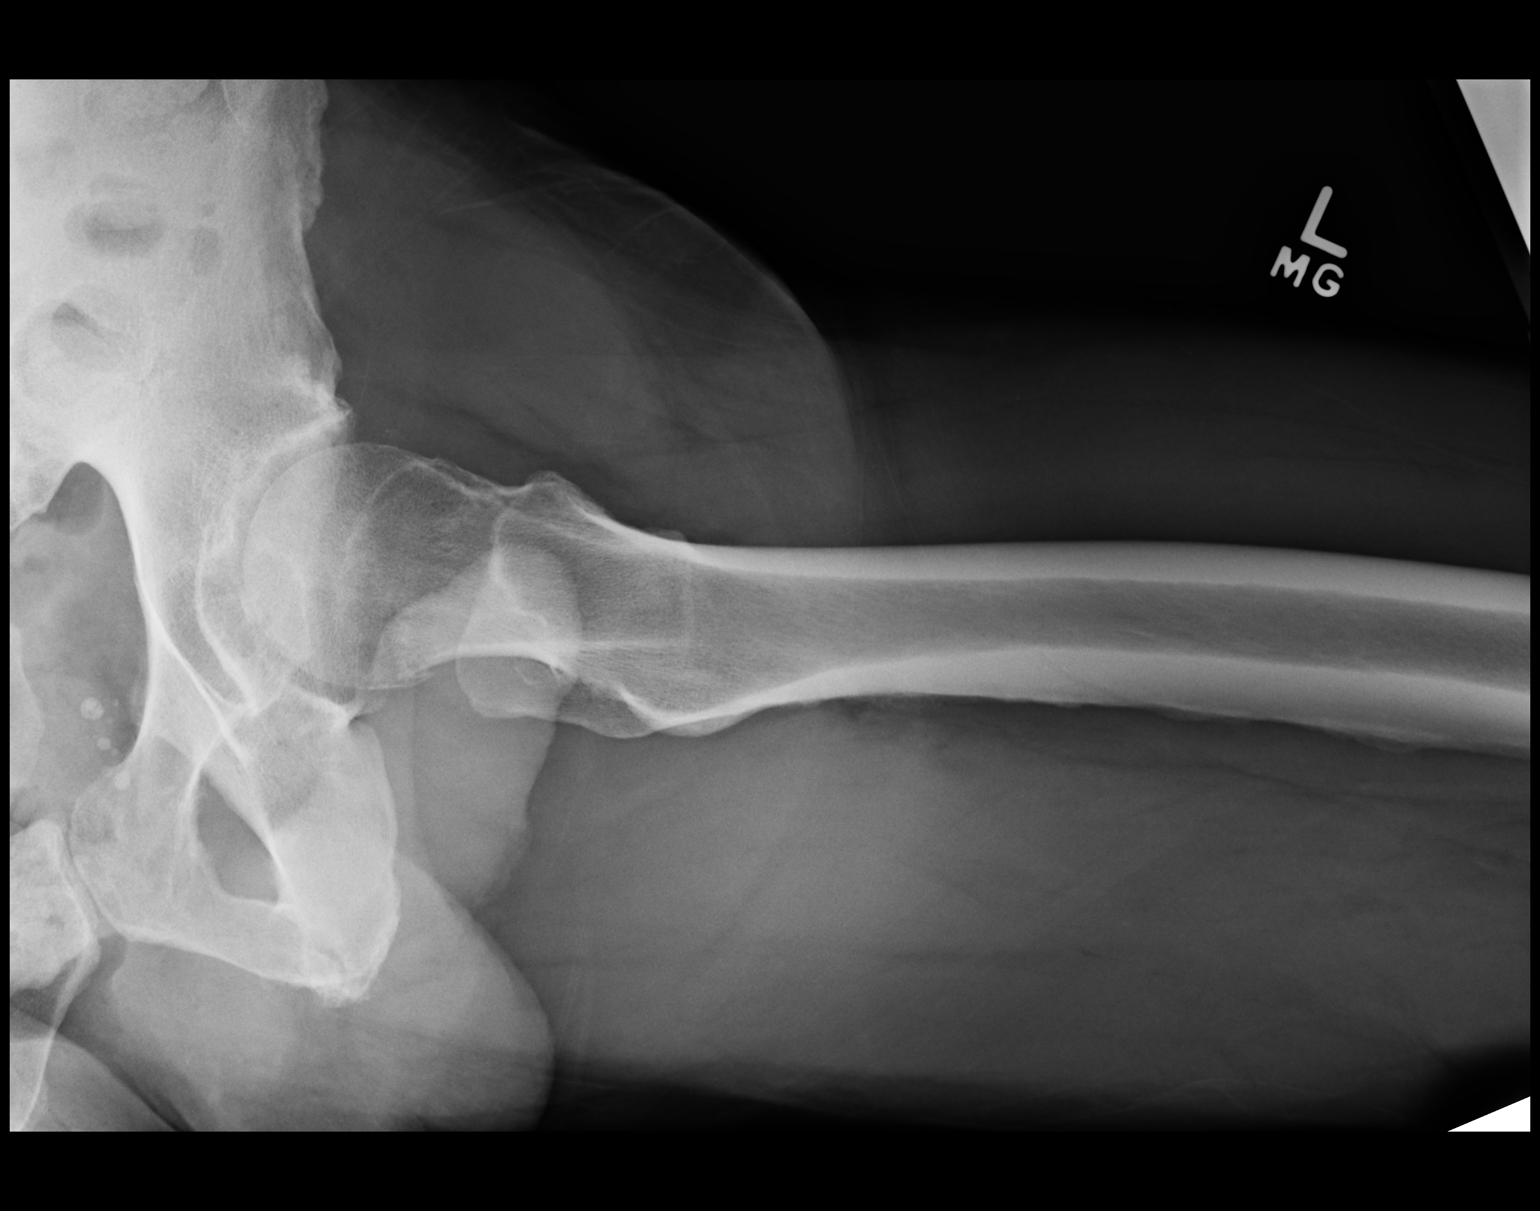

[3 of 3 positions shown; findings below may reference images not displayed]

FINDINGS: Lumbar spine: Frontal and lateral views of the lumbar spine
demonstrate 5 non-rib-bearing lumbar type vertebral bodies in
grossly normal alignment. Diffuse lumbar spondylosis greatest at
L4/L5, slightly progressive since prior study. Multilevel facet
hypertrophic changes greatest at L4-5 and L5-S1. No acute fractures.
Sacroiliac joints are normal.

Left hip: Frontal view of the pelvis as well as frontal and frogleg
lateral views of the left hip are obtained. No fracture,
subluxation, or dislocation. Moderate bilateral hip osteoarthritis,
right greater than left. Soft tissues are unremarkable.

Left femur: Frontal and lateral views demonstrate no fractures. Mild
osteoarthritis of the left knee. Soft tissues are unremarkable.
IMPRESSION: 1. Progressive multilevel lumbar spondylosis and facet hypertrophy
greatest at L4/L5.
2. Bilateral hip osteoarthritis, right greater than left.
3. No acute or destructive bony lesions.

## 2022-09-15 DIAGNOSIS — K069 Disorder of gingiva and edentulous alveolar ridge, unspecified: Secondary | ICD-10-CM | POA: Diagnosis not present

## 2022-10-03 DIAGNOSIS — M545 Low back pain, unspecified: Secondary | ICD-10-CM | POA: Diagnosis not present

## 2022-10-03 DIAGNOSIS — M7918 Myalgia, other site: Secondary | ICD-10-CM | POA: Diagnosis not present

## 2022-10-14 DIAGNOSIS — M1711 Unilateral primary osteoarthritis, right knee: Secondary | ICD-10-CM | POA: Diagnosis not present

## 2022-12-08 ENCOUNTER — Ambulatory Visit: Payer: Medicare Other | Admitting: Podiatry

## 2022-12-08 ENCOUNTER — Encounter: Payer: Self-pay | Admitting: Podiatry

## 2022-12-08 DIAGNOSIS — M779 Enthesopathy, unspecified: Secondary | ICD-10-CM | POA: Diagnosis not present

## 2022-12-08 NOTE — Progress Notes (Signed)
Subjective:   Patient ID: Joshua Ellis, male   DOB: 72 y.o.   MRN: [id number]   HPI Patient presents concerned about history of flatfoot deformity with history of capsulitis and fasciitis condition.  Patient does not smoke likes to be active   Review of Systems  All other systems reviewed and are negative.       Objective:  Physical Exam Vitals and nursing note reviewed.  Constitutional:      Appearance: He is well-developed.  Pulmonary:     Effort: Pulmonary effort is normal.  Musculoskeletal:        General: Normal range of motion.  Skin:    General: Skin is warm.  Neurological:     Mental Status: He is alert.     Neurovascular status intact muscle strength adequate range of motion adequate no indications of flatfoot deformity mild discomfort plantar feet     Assessment:  No current indications of flatfoot deformity bilateral mild fascial inflammation     Plan:  Age and PE discussed good shoe gear choices with support along with stretching do not recommend other treatment currently

## 2023-01-10 DIAGNOSIS — S0001XA Abrasion of scalp, initial encounter: Secondary | ICD-10-CM | POA: Diagnosis not present

## 2023-01-12 DIAGNOSIS — H40013 Open angle with borderline findings, low risk, bilateral: Secondary | ICD-10-CM | POA: Diagnosis not present

## 2023-01-12 DIAGNOSIS — L03811 Cellulitis of head [any part, except face]: Secondary | ICD-10-CM | POA: Diagnosis not present

## 2023-01-12 DIAGNOSIS — R519 Headache, unspecified: Secondary | ICD-10-CM | POA: Diagnosis not present

## 2023-01-12 DIAGNOSIS — H9201 Otalgia, right ear: Secondary | ICD-10-CM | POA: Diagnosis not present

## 2023-01-12 DIAGNOSIS — H34831 Tributary (branch) retinal vein occlusion, right eye, with macular edema: Secondary | ICD-10-CM | POA: Diagnosis not present

## 2023-01-12 DIAGNOSIS — H25813 Combined forms of age-related cataract, bilateral: Secondary | ICD-10-CM | POA: Diagnosis not present

## 2023-01-14 DIAGNOSIS — H40013 Open angle with borderline findings, low risk, bilateral: Secondary | ICD-10-CM | POA: Diagnosis not present

## 2023-01-25 DIAGNOSIS — H43813 Vitreous degeneration, bilateral: Secondary | ICD-10-CM | POA: Diagnosis not present

## 2023-01-25 DIAGNOSIS — H40013 Open angle with borderline findings, low risk, bilateral: Secondary | ICD-10-CM | POA: Diagnosis not present

## 2023-01-25 DIAGNOSIS — H34831 Tributary (branch) retinal vein occlusion, right eye, with macular edema: Secondary | ICD-10-CM | POA: Diagnosis not present

## 2023-02-02 DIAGNOSIS — M7918 Myalgia, other site: Secondary | ICD-10-CM | POA: Diagnosis not present

## 2023-02-02 DIAGNOSIS — M546 Pain in thoracic spine: Secondary | ICD-10-CM | POA: Diagnosis not present

## 2023-02-09 DIAGNOSIS — M546 Pain in thoracic spine: Secondary | ICD-10-CM | POA: Diagnosis not present

## 2023-02-10 DIAGNOSIS — H34831 Tributary (branch) retinal vein occlusion, right eye, with macular edema: Secondary | ICD-10-CM | POA: Diagnosis not present

## 2023-02-20 DIAGNOSIS — M546 Pain in thoracic spine: Secondary | ICD-10-CM | POA: Diagnosis not present

## 2023-02-20 DIAGNOSIS — M51369 Other intervertebral disc degeneration, lumbar region without mention of lumbar back pain or lower extremity pain: Secondary | ICD-10-CM | POA: Diagnosis not present

## 2023-02-23 ENCOUNTER — Other Ambulatory Visit: Payer: Self-pay | Admitting: Physician Assistant

## 2023-03-10 DIAGNOSIS — H43813 Vitreous degeneration, bilateral: Secondary | ICD-10-CM | POA: Diagnosis not present

## 2023-03-10 DIAGNOSIS — H34831 Tributary (branch) retinal vein occlusion, right eye, with macular edema: Secondary | ICD-10-CM | POA: Diagnosis not present

## 2023-03-10 DIAGNOSIS — H40013 Open angle with borderline findings, low risk, bilateral: Secondary | ICD-10-CM | POA: Diagnosis not present

## 2023-03-15 DIAGNOSIS — R3121 Asymptomatic microscopic hematuria: Secondary | ICD-10-CM | POA: Diagnosis not present

## 2023-03-17 DIAGNOSIS — M545 Low back pain, unspecified: Secondary | ICD-10-CM | POA: Diagnosis not present

## 2023-03-17 DIAGNOSIS — R531 Weakness: Secondary | ICD-10-CM | POA: Diagnosis not present

## 2023-03-17 DIAGNOSIS — R262 Difficulty in walking, not elsewhere classified: Secondary | ICD-10-CM | POA: Diagnosis not present

## 2023-03-20 DIAGNOSIS — R3129 Other microscopic hematuria: Secondary | ICD-10-CM | POA: Diagnosis not present

## 2023-04-08 DIAGNOSIS — M545 Low back pain, unspecified: Secondary | ICD-10-CM | POA: Diagnosis not present

## 2023-04-12 DIAGNOSIS — R3129 Other microscopic hematuria: Secondary | ICD-10-CM | POA: Diagnosis not present

## 2023-04-13 DIAGNOSIS — R109 Unspecified abdominal pain: Secondary | ICD-10-CM | POA: Diagnosis not present

## 2023-04-13 DIAGNOSIS — R3129 Other microscopic hematuria: Secondary | ICD-10-CM | POA: Diagnosis not present

## 2023-04-13 DIAGNOSIS — R3121 Asymptomatic microscopic hematuria: Secondary | ICD-10-CM | POA: Diagnosis not present

## 2023-04-13 DIAGNOSIS — K5730 Diverticulosis of large intestine without perforation or abscess without bleeding: Secondary | ICD-10-CM | POA: Diagnosis not present

## 2023-04-13 DIAGNOSIS — N200 Calculus of kidney: Secondary | ICD-10-CM | POA: Diagnosis not present

## 2023-04-13 DIAGNOSIS — R1084 Generalized abdominal pain: Secondary | ICD-10-CM | POA: Diagnosis not present

## 2023-04-17 ENCOUNTER — Ambulatory Visit: Payer: Medicare Other | Attending: Cardiology | Admitting: Cardiology

## 2023-04-17 VITALS — BP 130/70 | HR 72 | Ht 67.0 in | Wt 164.0 lb

## 2023-04-17 DIAGNOSIS — I10 Essential (primary) hypertension: Secondary | ICD-10-CM | POA: Diagnosis not present

## 2023-04-17 DIAGNOSIS — I2510 Atherosclerotic heart disease of native coronary artery without angina pectoris: Secondary | ICD-10-CM | POA: Diagnosis not present

## 2023-04-17 DIAGNOSIS — E785 Hyperlipidemia, unspecified: Secondary | ICD-10-CM

## 2023-04-17 NOTE — Patient Instructions (Signed)
 Medication Instructions:  The current medical regimen is effective;  continue present plan and medications.  *If you need a refill on your cardiac medications before your next appointment, please call your pharmacy*  Follow-Up: At [facility], you and your health needs are our priority.  As part of our continuing mission to provide you with exceptional heart care, we have created designated Provider Care Teams.  These Care Teams include your primary Cardiologist (physician) and Advanced Practice Providers (APPs -  Physician Assistants and Nurse Practitioners) who all work together to provide you with the care you need, when you need it.  We recommend signing up for the patient portal called "MyChart".  Sign up information is provided on this After Visit Summary.  MyChart is used to connect with patients for Virtual Visits (Telemedicine).  Patients are able to view lab/test results, encounter notes, upcoming appointments, etc.  Non-urgent messages can be sent to your provider as well.   To learn more about what you can do with MyChart, go to [email or web address].    Your next appointment:   Follow up as needed.

## 2023-04-17 NOTE — Progress Notes (Signed)
 Cardiology Office Note:  .   Date:  04/17/2023  ID:  Joshua Ellis, DOB Jun 28, 1950, MRN [id number] PCP: Joshua Ellis Other, MD  [facility] Providers Cardiologist:  Oneil Parchment, MD     History of Present Illness: .   Joshua Ellis is a 73 y.o. 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 He also has a family history of myocardial infarction, with his father passing away in his seventies due to the condition.    The patient is physically active, playing golf three days a week when weather permits. He was started on low dose losartan  25 mg a day at his last visit, and his blood pressure today is 130/70. He is also on Crestor  20 mg a day, and his LDL is 70. His Hemoglobin A1c is 6.1, indicating prediabetes, and his creatinine is 0.7.  He underwent a coronary artery calcium  score test, which showed a score of 30, indicating some calcification. The patient is not experiencing any heart-related symptoms and is primarily seeking preventive care and maintenance for his cholesterol levels.  The patient is active in his community, officiating golf tournaments, and enjoys a good quality of life.      ROS: No CP, no SOB  Studies Reviewed: SABRA   EKG Interpretation Date/Time:  Monday April 17 2023 17:07:27 EST Ventricular Rate:  72 PR Interval:  166 QRS Duration:  74 QT Interval:  366 QTC Calculation: 400 R Axis:   71  Text Interpretation: Sinus rhythm  with premature atrial contractions When compared with ECG of 30-Dec-2018 15:45, PREVIOUS ECG IS PRESENT Confirmed by Parchment Oneil ([id number]) on 04/17/2023 5:13:12 PM    Results   LABS LDL: 70 (05/20/2022) Hemoglobin A1c: 6.1 (05/20/2022) Creatinine: 0.7 (05/20/2022)  RADIOLOGY Coronary artery calcium  score: 30 (06/2020)  DIAGNOSTIC EKG:  Normal     Risk Assessment/Calculations:            Physical Exam:   VS:  BP 130/70 (BP Location: Right Arm)   Pulse 72   Ht 5' 7 (1.702 m)   Wt 164 lb (74.4 kg)   SpO2 97%   BMI 25.69 kg/m    Wt Readings from Last 3 Encounters:  04/17/23 164 lb (74.4 kg)  04/12/22 156 lb (70.8 kg)  02/16/21 149 lb 9.6 oz (67.9 kg)    GEN: Well nourished, well developed in no acute distress NECK: No JVD; No carotid bruits CARDIAC: RRR, no murmurs, no rubs, no gallops RESPIRATORY:  Clear to auscultation without rales, wheezing or rhonchi  ABDOMEN: Soft, non-tender, non-distended EXTREMITIES:  No edema; No deformity   ASSESSMENT AND PLAN: .    Assessment and Plan    Coronary Artery Calcification Coronary artery calcium  score of 30, indicating mild calcification. Discussed potential benefits and risks of low-dose aspirin, including bleeding, to reduce inflammation and prevent further calcification. - Consider starting low-dose aspirin after discussing with primary care physician  Hypertension Hypertension is well-controlled with current medication regimen. Blood pressure today is 130/70 mmHg. Currently on losartan  25 mg daily. Discussed benefits of continued blood pressure control in reducing cardiovascular risk. - Continue losartan  25 mg daily  Hyperlipidemia Hyperlipidemia is well-managed with current medication. LDL is 70 mg/dL. Currently on rosuvastatin  20 mg daily. Discussed benefits of maintaining LDL levels to reduce cardiovascular events. - Continue rosuvastatin  20 mg daily  Prediabetes Hemoglobin A1c is 6.1%, indicating prediabetes. Discussed importance of lifestyle modifications and regular monitoring to prevent progression to diabetes. - Monitor hemoglobin A1c as per primary care physician's recommendations  General Health Maintenance Active lifestyle, playing golf three days a week and officiating tournaments. No current symptoms of chest pain or shortness of breath. EKG is  normal. - Continue regular follow-ups with primary care physician - EKG as needed based on symptoms or annual physicals  Follow-up - As needed follow-up with cardiology if any new symptoms arise or if primary care physician recommends.               Signed, Oneil Parchment, MD

## 2023-04-21 DIAGNOSIS — H43813 Vitreous degeneration, bilateral: Secondary | ICD-10-CM | POA: Diagnosis not present

## 2023-04-21 DIAGNOSIS — H40013 Open angle with borderline findings, low risk, bilateral: Secondary | ICD-10-CM | POA: Diagnosis not present

## 2023-04-21 DIAGNOSIS — H34831 Tributary (branch) retinal vein occlusion, right eye, with macular edema: Secondary | ICD-10-CM | POA: Diagnosis not present

## 2023-04-21 DIAGNOSIS — H31091 Other chorioretinal scars, right eye: Secondary | ICD-10-CM | POA: Diagnosis not present

## 2023-04-27 ENCOUNTER — Other Ambulatory Visit: Payer: Self-pay | Admitting: Physician Assistant

## 2023-05-01 DIAGNOSIS — M545 Low back pain, unspecified: Secondary | ICD-10-CM | POA: Diagnosis not present

## 2023-05-12 DIAGNOSIS — M545 Low back pain, unspecified: Secondary | ICD-10-CM | POA: Diagnosis not present

## 2023-05-23 DIAGNOSIS — I10 Essential (primary) hypertension: Secondary | ICD-10-CM | POA: Diagnosis not present

## 2023-05-23 DIAGNOSIS — E785 Hyperlipidemia, unspecified: Secondary | ICD-10-CM | POA: Diagnosis not present

## 2023-05-23 DIAGNOSIS — R7303 Prediabetes: Secondary | ICD-10-CM | POA: Diagnosis not present

## 2023-05-31 DIAGNOSIS — Z1389 Encounter for screening for other disorder: Secondary | ICD-10-CM | POA: Diagnosis not present

## 2023-05-31 DIAGNOSIS — M47816 Spondylosis without myelopathy or radiculopathy, lumbar region: Secondary | ICD-10-CM | POA: Diagnosis not present

## 2023-05-31 DIAGNOSIS — I2510 Atherosclerotic heart disease of native coronary artery without angina pectoris: Secondary | ICD-10-CM | POA: Diagnosis not present

## 2023-05-31 DIAGNOSIS — Z0000 Encounter for general adult medical examination without abnormal findings: Secondary | ICD-10-CM | POA: Diagnosis not present

## 2023-05-31 DIAGNOSIS — R7303 Prediabetes: Secondary | ICD-10-CM | POA: Diagnosis not present

## 2023-05-31 DIAGNOSIS — Z1211 Encounter for screening for malignant neoplasm of colon: Secondary | ICD-10-CM | POA: Diagnosis not present

## 2023-05-31 DIAGNOSIS — I491 Atrial premature depolarization: Secondary | ICD-10-CM | POA: Diagnosis not present

## 2023-05-31 DIAGNOSIS — J309 Allergic rhinitis, unspecified: Secondary | ICD-10-CM | POA: Diagnosis not present

## 2023-06-07 DIAGNOSIS — I2510 Atherosclerotic heart disease of native coronary artery without angina pectoris: Secondary | ICD-10-CM | POA: Diagnosis not present

## 2023-06-07 DIAGNOSIS — E785 Hyperlipidemia, unspecified: Secondary | ICD-10-CM | POA: Diagnosis not present

## 2023-06-07 DIAGNOSIS — Z713 Dietary counseling and surveillance: Secondary | ICD-10-CM | POA: Diagnosis not present

## 2023-06-07 DIAGNOSIS — I10 Essential (primary) hypertension: Secondary | ICD-10-CM | POA: Diagnosis not present

## 2023-06-07 DIAGNOSIS — R7303 Prediabetes: Secondary | ICD-10-CM | POA: Diagnosis not present

## 2023-06-16 DIAGNOSIS — H34831 Tributary (branch) retinal vein occlusion, right eye, with macular edema: Secondary | ICD-10-CM | POA: Diagnosis not present

## 2023-06-16 DIAGNOSIS — H31091 Other chorioretinal scars, right eye: Secondary | ICD-10-CM | POA: Diagnosis not present

## 2023-06-16 DIAGNOSIS — H40013 Open angle with borderline findings, low risk, bilateral: Secondary | ICD-10-CM | POA: Diagnosis not present

## 2023-06-16 DIAGNOSIS — H43813 Vitreous degeneration, bilateral: Secondary | ICD-10-CM | POA: Diagnosis not present

## 2023-07-03 DIAGNOSIS — K5520 Angiodysplasia of colon without hemorrhage: Secondary | ICD-10-CM | POA: Diagnosis not present

## 2023-07-03 DIAGNOSIS — Z1211 Encounter for screening for malignant neoplasm of colon: Secondary | ICD-10-CM | POA: Diagnosis not present

## 2023-07-10 DIAGNOSIS — I2510 Atherosclerotic heart disease of native coronary artery without angina pectoris: Secondary | ICD-10-CM | POA: Diagnosis not present

## 2023-07-10 DIAGNOSIS — E785 Hyperlipidemia, unspecified: Secondary | ICD-10-CM | POA: Diagnosis not present

## 2023-07-10 DIAGNOSIS — R7303 Prediabetes: Secondary | ICD-10-CM | POA: Diagnosis not present

## 2023-07-10 DIAGNOSIS — Z713 Dietary counseling and surveillance: Secondary | ICD-10-CM | POA: Diagnosis not present

## 2023-07-10 DIAGNOSIS — I10 Essential (primary) hypertension: Secondary | ICD-10-CM | POA: Diagnosis not present

## 2023-07-23 DIAGNOSIS — K047 Periapical abscess without sinus: Secondary | ICD-10-CM | POA: Diagnosis not present

## 2023-08-05 DIAGNOSIS — S61215A Laceration without foreign body of left ring finger without damage to nail, initial encounter: Secondary | ICD-10-CM | POA: Diagnosis not present

## 2023-08-05 DIAGNOSIS — W293XXA Contact with powered garden and outdoor hand tools and machinery, initial encounter: Secondary | ICD-10-CM | POA: Diagnosis not present

## 2023-08-05 DIAGNOSIS — Z23 Encounter for immunization: Secondary | ICD-10-CM | POA: Diagnosis not present

## 2023-08-07 DIAGNOSIS — S61215A Laceration without foreign body of left ring finger without damage to nail, initial encounter: Secondary | ICD-10-CM | POA: Diagnosis not present

## 2023-08-07 DIAGNOSIS — S61219A Laceration without foreign body of unspecified finger without damage to nail, initial encounter: Secondary | ICD-10-CM | POA: Diagnosis not present

## 2023-08-29 DIAGNOSIS — L989 Disorder of the skin and subcutaneous tissue, unspecified: Secondary | ICD-10-CM | POA: Diagnosis not present

## 2023-09-14 DIAGNOSIS — M549 Dorsalgia, unspecified: Secondary | ICD-10-CM | POA: Diagnosis not present

## 2023-09-15 DIAGNOSIS — M545 Low back pain, unspecified: Secondary | ICD-10-CM | POA: Diagnosis not present

## 2023-09-20 DIAGNOSIS — R143 Flatulence: Secondary | ICD-10-CM | POA: Diagnosis not present

## 2023-09-20 DIAGNOSIS — M5489 Other dorsalgia: Secondary | ICD-10-CM | POA: Diagnosis not present

## 2023-09-20 DIAGNOSIS — R109 Unspecified abdominal pain: Secondary | ICD-10-CM | POA: Diagnosis not present

## 2023-09-26 DIAGNOSIS — M545 Low back pain, unspecified: Secondary | ICD-10-CM | POA: Diagnosis not present

## 2023-09-27 DIAGNOSIS — N200 Calculus of kidney: Secondary | ICD-10-CM | POA: Diagnosis not present

## 2023-09-27 DIAGNOSIS — R109 Unspecified abdominal pain: Secondary | ICD-10-CM | POA: Diagnosis not present

## 2023-09-28 DIAGNOSIS — N200 Calculus of kidney: Secondary | ICD-10-CM | POA: Diagnosis not present

## 2023-09-28 DIAGNOSIS — R1084 Generalized abdominal pain: Secondary | ICD-10-CM | POA: Diagnosis not present

## 2023-10-02 DIAGNOSIS — Z6823 Body mass index (BMI) 23.0-23.9, adult: Secondary | ICD-10-CM | POA: Diagnosis not present

## 2023-10-02 DIAGNOSIS — M25562 Pain in left knee: Secondary | ICD-10-CM | POA: Diagnosis not present

## 2023-10-02 DIAGNOSIS — M25519 Pain in unspecified shoulder: Secondary | ICD-10-CM | POA: Diagnosis not present

## 2023-10-02 DIAGNOSIS — M25561 Pain in right knee: Secondary | ICD-10-CM | POA: Diagnosis not present

## 2023-10-02 DIAGNOSIS — M1991 Primary osteoarthritis, unspecified site: Secondary | ICD-10-CM | POA: Diagnosis not present

## 2023-10-03 DIAGNOSIS — M7918 Myalgia, other site: Secondary | ICD-10-CM | POA: Diagnosis not present

## 2023-10-05 ENCOUNTER — Encounter: Payer: Self-pay | Admitting: Podiatry

## 2023-10-05 ENCOUNTER — Ambulatory Visit: Admitting: Podiatry

## 2023-10-05 VITALS — Ht 67.0 in | Wt 164.0 lb

## 2023-10-05 DIAGNOSIS — M722 Plantar fascial fibromatosis: Secondary | ICD-10-CM

## 2023-10-05 MED ORDER — TRIAMCINOLONE ACETONIDE 10 MG/ML IJ SUSP
10.0000 mg | Freq: Once | INTRAMUSCULAR | Status: AC
  Administered 2023-10-05: 10 mg via INTRA_ARTICULAR

## 2023-10-05 NOTE — Progress Notes (Signed)
 Subjective:   Patient ID: Joshua Ellis, male   DOB: 73 y.o.   MRN: [id number]   HPI Patient presents with left heel pain states has been very sore and states he had an injection at the [facility] approximately 6 months ago.  Also feels like his arch is not high and he does have orthotics but has not been able to wear them neuro   ROS      Objective:  Physical Exam  Vascular status intact with exquisite discomfort medial fascial band left at the insertion of the tendon to the calcaneus with fluid buildup noted     Assessment:  Acute plantar fasciitis left with inflammation fluid buildup and depression of the arch     Plan:  H&P reviewed condition sterile prep and injected the left plantar fascia at insertion 3 mg Kenalog  5 mg Xylocaine and then dispensed a fascial brace to hold of the arch properly fitted into the arch to take stress off the fascia.  Reappoint to recheck  X-rays indicate spur of a moderate nature no indication stress fracture or arthritis

## 2023-10-09 DIAGNOSIS — M25551 Pain in right hip: Secondary | ICD-10-CM | POA: Diagnosis not present

## 2023-10-22 DIAGNOSIS — M546 Pain in thoracic spine: Secondary | ICD-10-CM | POA: Diagnosis not present

## 2023-10-24 DIAGNOSIS — M546 Pain in thoracic spine: Secondary | ICD-10-CM | POA: Diagnosis not present

## 2023-11-02 ENCOUNTER — Other Ambulatory Visit: Payer: Self-pay | Admitting: Cardiology

## 2023-11-02 DIAGNOSIS — M549 Dorsalgia, unspecified: Secondary | ICD-10-CM | POA: Diagnosis not present

## 2023-11-02 DIAGNOSIS — R6889 Other general symptoms and signs: Secondary | ICD-10-CM | POA: Diagnosis not present

## 2023-11-08 DIAGNOSIS — R202 Paresthesia of skin: Secondary | ICD-10-CM | POA: Diagnosis not present

## 2023-11-13 DIAGNOSIS — M79672 Pain in left foot: Secondary | ICD-10-CM | POA: Diagnosis not present

## 2023-11-16 DIAGNOSIS — M79672 Pain in left foot: Secondary | ICD-10-CM | POA: Diagnosis not present

## 2023-11-16 DIAGNOSIS — M722 Plantar fascial fibromatosis: Secondary | ICD-10-CM | POA: Diagnosis not present

## 2023-11-25 DIAGNOSIS — M5134 Other intervertebral disc degeneration, thoracic region: Secondary | ICD-10-CM | POA: Diagnosis not present

## 2023-12-01 ENCOUNTER — Ambulatory Visit (HOSPITAL_BASED_OUTPATIENT_CLINIC_OR_DEPARTMENT_OTHER): Admitting: Physician Assistant

## 2023-12-01 ENCOUNTER — Encounter (HOSPITAL_BASED_OUTPATIENT_CLINIC_OR_DEPARTMENT_OTHER): Payer: Self-pay

## 2023-12-01 DIAGNOSIS — M25562 Pain in left knee: Secondary | ICD-10-CM | POA: Diagnosis not present

## 2023-12-09 DIAGNOSIS — R220 Localized swelling, mass and lump, head: Secondary | ICD-10-CM | POA: Diagnosis not present

## 2023-12-13 DIAGNOSIS — M722 Plantar fascial fibromatosis: Secondary | ICD-10-CM | POA: Diagnosis not present

## 2023-12-13 DIAGNOSIS — R262 Difficulty in walking, not elsewhere classified: Secondary | ICD-10-CM | POA: Diagnosis not present

## 2023-12-25 DIAGNOSIS — M542 Cervicalgia: Secondary | ICD-10-CM | POA: Diagnosis not present

## 2024-02-05 ENCOUNTER — Encounter: Payer: Self-pay | Admitting: Radiology

## 2024-03-04 ENCOUNTER — Other Ambulatory Visit: Payer: Self-pay

## 2024-03-04 ENCOUNTER — Emergency Department (HOSPITAL_COMMUNITY)
Admission: EM | Admit: 2024-03-04 | Discharge: 2024-03-04 | Disposition: A | Discharge status: Home / Self Care | Source: Ambulatory Visit | Attending: Emergency Medicine | Admitting: Emergency Medicine

## 2024-03-04 DIAGNOSIS — M5412 Radiculopathy, cervical region: Secondary | ICD-10-CM | POA: Insufficient documentation

## 2024-03-04 NOTE — ED Triage Notes (Signed)
 Pt ambulatory to triage with complaints of RIGHT sided neck pain, along with numbness and tingling down the RIGHT arm. Pt states that the sx began approx 1 month ago, and have persisted. PT has been seen by [facility], and at the [facility] urgent care. Pt was told he needs an MRI, but can't wait until January.

## 2024-03-04 NOTE — Discharge Instructions (Signed)
 Follow up with your doctor as planned for MRI and further evaluation.

## 2024-03-04 NOTE — ED Provider Notes (Signed)
 [place] EMERGENCY DEPARTMENT AT [facility] Provider Note   CSN: [id number] Arrival date & time: 03/04/24  1810     Patient presents with: Neck Pain and Arm Pain   Joshua Ellis is a 73 y.o. male.    Neck Pain Arm Pain     Patient has been having difficulty with right neck pain and arm numbness ongoing for at least a month.  Patient has been seeing [facility].  He also went to the [facility].  Patient states he was at the [facility] today and they were trying to arrange for an MRI.  They could not schedule it until January.  [facility] and [facility] would [facility] the charge for the MRI.  Patient came here today to try to get an MRI of his neck.  He is not having any new weakness.  Patient has been taking NSAIDs.  He was also given a prescription for Neurontin but does not like the side effects so is not been taking that.    Prior to Admission medications   Medication Sig Start Date End Date Taking? Authorizing Provider  acetaminophen (TYLENOL) 500 MG tablet Take 500 mg by mouth as needed for moderate pain (pain score 4-6).    [provider]  Ascorbic Acid (VITAMIN C PO) Take 1 capsule by mouth daily.    [provider]  Cyanocobalamin (VITAMIN B-12 PO) Take 1 tablet by mouth daily.    [provider]  ELDERBERRY PO Take by mouth daily.    [provider]  fexofenadine (ALLEGRA) 60 MG tablet Take 30 mg by mouth daily.    [provider]  Joshua Ellis Omega-3 500 MG CAPS Take 500 mg by mouth daily.    [provider]  losartan  (COZAAR ) 25 MG tablet TAKE 1 TABLET BY MOUTH DAILY 04/28/23   Joshua Oneil BROCKS, MD  rosuvastatin  (CRESTOR ) 20 MG tablet TAKE 1 TABLET BY MOUTH DAILY 11/03/23   Joshua Oneil BROCKS, MD  Tart Cherry 500 MG CAPS Take by mouth daily.    [provider]    Allergies: Patient has no known allergies.    Review of Systems  Musculoskeletal:  Positive for neck pain.    Updated  Vital Signs BP (!) 138/101 (BP Location: Right Arm)   Pulse 72   Temp 98.1 F (36.7 C) (Oral)   Resp 18   SpO2 98%   Physical Exam Vitals and nursing note reviewed.  Constitutional:      General: He is not in acute distress.    Appearance: He is well-developed.  HENT:     Head: Normocephalic and atraumatic.     Right Ear: External ear normal.     Left Ear: External ear normal.  Eyes:     General: No scleral icterus.       Right eye: No discharge.        Left eye: No discharge.     Conjunctiva/sclera: Conjunctivae normal.  Neck:     Trachea: No tracheal deviation.  Cardiovascular:     Rate and Rhythm: Normal rate.  Pulmonary:     Effort: Pulmonary effort is normal. No respiratory distress.     Breath sounds: No stridor.  Abdominal:     General: There is no distension.  Musculoskeletal:        General: No swelling or deformity.     Cervical back: Neck supple.     Comments: Strong radial pulse  in the right upper extremity  Skin:    General: Skin is warm and dry.     Findings: No rash.  Neurological:     Mental Status: He is alert. Mental status is at baseline.     Cranial Nerves: No dysarthria or facial asymmetry.     Motor: No weakness or seizure activity.     Comments: Sensation intact     (all labs ordered are listed, but only abnormal results are displayed) Labs Reviewed - No data to display  EKG: None  Radiology: No results found.   Procedures   Medications Ordered in the ED - No data to display                                  Medical Decision Making  Patient presented to the ED with persistent right-sided neck and arm pain diagnosed as a cervical radiculopathy.  Patient is not having any acute changes.  He came to the ED hoping to get an MRI.  An outpatient 1 has been ordered but it is not scheduled for another month.  Patient did not want to wait that long.  Unfortunately MRI is not available at this facility at this time.  I explained to patient  that we do not have 24-hour MRI.  Patient states he will try to go to another emergency room tomorrow.  Evaluation and diagnostic testing in the emergency department does not suggest an emergent condition requiring admission or immediate intervention beyond what has been performed at this time.  The patient is safe for discharge and has been instructed to return immediately for worsening symptoms, change in symptoms or any other concerns.     Final diagnoses:  Cervical radiculopathy    ED Discharge Orders     None          Joshua Simmonds, MD 03/04/24 [place]

## 2024-04-10 ENCOUNTER — Other Ambulatory Visit: Payer: Self-pay | Admitting: Cardiology
